# Patient Record
Sex: Female | Born: 1965
Health system: Southern US, Community
[De-identification: ages and names within clinical notes are randomized; demographics above are authoritative.]

## PROBLEM LIST (undated history)

## (undated) DIAGNOSIS — I10 Essential (primary) hypertension: Secondary | ICD-10-CM

## (undated) DIAGNOSIS — F32A Depression, unspecified: Secondary | ICD-10-CM

## (undated) DIAGNOSIS — G629 Polyneuropathy, unspecified: Secondary | ICD-10-CM

## (undated) DIAGNOSIS — F329 Major depressive disorder, single episode, unspecified: Secondary | ICD-10-CM

## (undated) DIAGNOSIS — F988 Other specified behavioral and emotional disorders with onset usually occurring in childhood and adolescence: Secondary | ICD-10-CM

## (undated) DIAGNOSIS — G459 Transient cerebral ischemic attack, unspecified: Secondary | ICD-10-CM

## (undated) DIAGNOSIS — Z9689 Presence of other specified functional implants: Secondary | ICD-10-CM

## (undated) DIAGNOSIS — Z9884 Bariatric surgery status: Secondary | ICD-10-CM

## (undated) HISTORY — PX: EYE SURGERY: SHX253

## (undated) HISTORY — PX: ABDOMINAL HYSTERECTOMY: SHX81

## (undated) HISTORY — PX: ANKLE SURGERY: SHX546

## (undated) HISTORY — PX: CHOLECYSTECTOMY: SHX55

## (undated) HISTORY — PX: HAND SURGERY: SHX662

## (undated) HISTORY — PX: BACK SURGERY: SHX140

## (undated) HISTORY — PX: GASTRIC BYPASS: SHX52

## (undated) HISTORY — PX: TONSILLECTOMY: SUR1361

---

## 2008-07-12 ENCOUNTER — Ambulatory Visit (HOSPITAL_BASED_OUTPATIENT_CLINIC_OR_DEPARTMENT_OTHER): Admission: RE | Admit: 2008-07-12 | Discharge: 2008-07-12 | Payer: Self-pay | Admitting: Ophthalmology

## 2008-07-24 ENCOUNTER — Other Ambulatory Visit: Admission: RE | Admit: 2008-07-24 | Discharge: 2008-07-24 | Payer: Self-pay | Admitting: Obstetrics and Gynecology

## 2008-09-26 ENCOUNTER — Ambulatory Visit (HOSPITAL_COMMUNITY): Admission: RE | Admit: 2008-09-26 | Discharge: 2008-09-27 | Payer: Self-pay | Admitting: Obstetrics and Gynecology

## 2008-09-26 ENCOUNTER — Encounter (INDEPENDENT_AMBULATORY_CARE_PROVIDER_SITE_OTHER): Payer: Self-pay | Admitting: Obstetrics and Gynecology

## 2009-04-17 ENCOUNTER — Emergency Department (HOSPITAL_COMMUNITY): Admission: EM | Admit: 2009-04-17 | Discharge: 2009-04-17 | Payer: Self-pay | Admitting: Emergency Medicine

## 2009-11-15 ENCOUNTER — Encounter: Payer: Self-pay | Admitting: Internal Medicine

## 2009-11-15 ENCOUNTER — Emergency Department (HOSPITAL_COMMUNITY): Admission: EM | Admit: 2009-11-15 | Discharge: 2009-11-15 | Payer: Self-pay | Admitting: Emergency Medicine

## 2009-11-17 ENCOUNTER — Encounter: Payer: Self-pay | Admitting: Internal Medicine

## 2009-12-22 DIAGNOSIS — I471 Supraventricular tachycardia, unspecified: Secondary | ICD-10-CM | POA: Insufficient documentation

## 2009-12-23 ENCOUNTER — Ambulatory Visit: Payer: Self-pay | Admitting: Internal Medicine

## 2010-01-14 ENCOUNTER — Ambulatory Visit: Payer: Self-pay

## 2010-01-14 ENCOUNTER — Ambulatory Visit: Payer: Self-pay | Admitting: Internal Medicine

## 2010-01-14 ENCOUNTER — Ambulatory Visit: Payer: Self-pay | Admitting: Cardiology

## 2010-01-14 ENCOUNTER — Encounter: Payer: Self-pay | Admitting: Internal Medicine

## 2010-01-14 ENCOUNTER — Ambulatory Visit (HOSPITAL_COMMUNITY): Admission: RE | Admit: 2010-01-14 | Discharge: 2010-01-14 | Payer: Self-pay | Admitting: Internal Medicine

## 2010-01-14 DIAGNOSIS — R000 Tachycardia, unspecified: Secondary | ICD-10-CM | POA: Insufficient documentation

## 2010-01-14 DIAGNOSIS — R0602 Shortness of breath: Secondary | ICD-10-CM | POA: Insufficient documentation

## 2010-01-15 LAB — CONVERTED CEMR LAB
BUN: 16 mg/dL (ref 6–23)
Basophils Absolute: 0 10*3/uL (ref 0.0–0.1)
Calcium: 9.2 mg/dL (ref 8.4–10.5)
Chloride: 101 meq/L (ref 96–112)
Eosinophils Absolute: 0.2 10*3/uL (ref 0.0–0.7)
GFR calc non Af Amer: 108.13 mL/min (ref >60)
HCT: 34.8 % — ABNORMAL LOW (ref 36.0–46.0)
Lymphocytes Relative: 19.8 % (ref 12.0–46.0)
Lymphs Abs: 1.6 10*3/uL (ref 0.7–4.0)
MCHC: 34.2 g/dL (ref 30.0–36.0)
Monocytes Absolute: 0.5 10*3/uL (ref 0.1–1.0)
Potassium: 4.5 meq/L (ref 3.5–5.1)
Prothrombin Time: 10.2 s (ref 9.7–11.8)

## 2010-01-21 ENCOUNTER — Ambulatory Visit (HOSPITAL_COMMUNITY): Admission: RE | Admit: 2010-01-21 | Discharge: 2010-01-21 | Payer: Self-pay | Admitting: Internal Medicine

## 2010-01-21 ENCOUNTER — Ambulatory Visit: Payer: Self-pay | Admitting: Internal Medicine

## 2010-02-02 ENCOUNTER — Telehealth (INDEPENDENT_AMBULATORY_CARE_PROVIDER_SITE_OTHER): Payer: Self-pay | Admitting: *Deleted

## 2010-02-03 ENCOUNTER — Encounter: Payer: Self-pay | Admitting: Internal Medicine

## 2010-02-10 ENCOUNTER — Ambulatory Visit: Payer: Self-pay | Admitting: Internal Medicine

## 2010-02-10 ENCOUNTER — Ambulatory Visit (HOSPITAL_COMMUNITY): Admission: RE | Admit: 2010-02-10 | Discharge: 2010-02-10 | Payer: Self-pay | Admitting: Internal Medicine

## 2010-02-25 ENCOUNTER — Encounter: Payer: Self-pay | Admitting: Internal Medicine

## 2010-02-26 ENCOUNTER — Ambulatory Visit: Payer: Self-pay | Admitting: Internal Medicine

## 2010-06-28 ENCOUNTER — Encounter: Payer: Self-pay | Admitting: Obstetrics and Gynecology

## 2010-07-07 NOTE — Assessment & Plan Note (Signed)
Summary: nep/tachycardia/jml   CC:  new patient.  Tachycardia.  Pt describes this as a pounding sensation in her chest.  .  History of Present Illness: [name] is seen at the request of Dr. Tiburcio Pea because of recurrent tachycardia palpitations with documented supraventricular tachycardia.  She is a 45 year old woman with a history of chronic pain syndromes related to neuropathies for which she is on multiple narcotics. She also has a ten-year history of abrupt onset onset tachypalpitations typically lasting just minutes.  Recently she had an episode however that awakened her from sleep and because it did not abate she went to the emergency room She was treated with diltiazem with termination and sounds like recurrence and termination again. ECG was obtained that documented narrow QRS tachycardia and a cycle length of 400 ms. These episodes are "frog positive" as well as associated with chest pressure and shortness of breath there is no lightheadedness. they are diuretic negative.    Current Medications (verified): 1)  Neurontin 400 Mg Caps (Gabapentin) .... 3 Capsules Four Times Daily 2)  Nortriptyline Hcl 75 Mg Caps (Nortriptyline Hcl) .... Take Two Capsules Two Times A Day 3)  Opana Er 30 Mg Xr12h-Tab (Oxymorphone Hcl) .... Take One Capsule Three Times A Day 4)  Opana 10 Mg Tabs (Oxymorphone Hcl) .... As Needed For Pain 5)  Avinza 90 Mg Xr24h-Cap (Morphine Sulfate Beads) .... Take One Capsule Three Times A Day 6)  Aspirin 81 Mg Tabs (Aspirin) .... Once Daily 7)  Diltiazem Hcl 120 Mg Tabs (Diltiazem Hcl) .... Take One Tablet Once Daily  Allergies (verified): 1)  ! Iodine 2)  ! * Iv Ig  Past History:  Past Medical History: Last updated: 03-Jan-2010 palpitations Paroxysmal supraventricular tachycardia degenerative disk disease history of TIAs demyelinating polyneuropathy staph and E. coli infection-2005  Past Surgical History: Last updated: Jan 03, 2010 Vaginal hysterectomy eye  surgery on July 12, 2008 gastric bypass  in 2001 cholecystectomy in 1998 tonsillectomy in 1974  Family History: Last updated: 01-03-10 Father: deceased-HTN, cancer, thyroid,aneurysm Mother:  alive-osteoporosis, melanoma  Social History: Last updated: 2010/01/03 Tobacco Use - Former.  [phone] Alcohol Use - yes Drug Use - no Disabled  Married   Review of Systems       full review of systems was negative apart from a history of present illness and past medical history.   Vital Signs:  Patient profile:   45 year old female Height:      67.5 inches Weight:      214 pounds BMI:     33.14 Pulse rate:   81 / minute Pulse rhythm:   regular BP sitting:   116 / 73  (left arm) Cuff size:   regular  Vitals Entered By: Judithe Modest CMA (December 23, 2009 3:57 PM)  Physical Exam  General:  Well developed, well nourished middle-aged Caucasian female appearing her stated age, in no acute distress. Head:  normal HEENT Neck:  supple without thyromegaly or venous distention Chest Wall:  without kyphosis or scoliosis or CVA tenderness Lungs:  clear to auscultation Heart:  regular rate and rhythm with a widely split but mobile S2 no significant murmurs were noted Abdomen:  soft nontender without hepatomegaly or midline pulsation Msk:  normal muscle strength and tone Pulses:  intact distal pulses Extremities:  No clubbing or cyanosis. Neurologic:  alert and oriented and grossly normal motor function Skin:  warm and dry Psych:  somewhat flat affect   EKG  Procedure date:  12/23/2009  Findings:  sinus rhythm with an R. prime in lead V1    EKG  Procedure date:  11/15/2009  Findings:      narrow QRS 148 pseudo s wave 2,3,f and broader Rwave in V1  Impression & Recommendations:  Problem # 1:  PAROXYSMAL SUPRAVENTRICULAR TACHYCARDIA (ICD-427.0) the patient has supraventricular tachycardia documented and probably AV node reentry by ECG and symptoms. We have discussed  treatment options including doing nothing, using vagal maneuvers with or without drug therapy including beta blockers and/or calcium blockers, antiarrhythmic drugs with the potential for proarrhythmia as well as catheter ablation. We discussed potential benefits as well as potential risks including but not limited to death perforation and heart block requiring permanent pacemaker implantation. She understands these risks and would like to proceed.  we will require an echo cardiogram prior to catheter ablation.  Given her chronic polyneuropathy and her narcotic dependence, the procedure will be done with anesthesia support. Her updated medication list for this problem includes:    Aspirin 81 Mg Tabs (Aspirin) ..... Once daily    Diltiazem Hcl 120 Mg Tabs (Diltiazem hcl) .Marland Kitchen... Take one tablet once daily  Orders: EKG w/ Interpretation (93000) Echocardiogram (Echo)  Problem # 2:  CHRONIC PAIN SYNDROME SECONDARY TO NEUROPATHY (ICD-338.4) see above  Patient Instructions: 1)  Your physician has requested that you have an echocardiogram.  Echocardiography is a painless test that uses sound waves to create images of your heart. It provides your doctor with information about the size and shape of your heart and how well your heart's chambers and valves are working.  This procedure takes approximately one hour. There are no restrictions for this procedure. 2)  Your physician has recommended that you have an ablation.  Catheter ablation is a medical procedure used to treat some cardiac arrhythmias (irregular heartbeats). During catheter ablation, a long, thin, flexible tube is put into a blood vessel in your groin (upper thigh), or neck. This tube is called an ablation catheter. It is then guided to your heart through the blood vessel. Radiofrequency waves destroy small areas of heart tissue where abnormal heartbeats may cause an arrhythmia to start.  Please see the instruction sheet given to you today.

## 2010-07-07 NOTE — Assessment & Plan Note (Signed)
Summary: eph/post ablation/mt   Visit Type:  Follow-up Primary Provider:  Durwin Nora, MD   History of Present Illness: [name] is seen in followup for ablation of SVT that turned out to be AV nodal reentry. She's had no recurrent tachypalpitations.  She did describe having gone in for her TEE which was done to exclude right to left shunting for her TIA. This parenthetically was negative. She notes however that when she awakened and went home that narcotics from her pocketbook allegedly had been removed. She did not followup with the hospital about this. (I have spoken with the director of endoscopy about this)  Current Medications (verified): 1)  Neurontin 400 Mg Caps (Gabapentin) .... 3 Capsules Four Times Daily 2)  Nortriptyline Hcl 75 Mg Caps (Nortriptyline Hcl) .... Take Two Capsules At Bedtime 3)  Opana 10 Mg Tabs (Oxymorphone Hcl) .... 5 Tabs Daily 4)  Avinza 90 Mg Xr24h-Cap (Morphine Sulfate Beads) .... Take One Capsule Three Times A Day 5)  Aspirin 81 Mg Tabs (Aspirin) .... Once Daily  Allergies: 1)  ! Iodine 2)  ! * Iv Ig  Past History:  Past Medical History: Last updated: 12/22/2009 palpitations Paroxysmal supraventricular tachycardia degenerative disk disease history of TIAs demyelinating polyneuropathy staph and E. coli infection-2005  Vital Signs:  Patient profile:   45 year old female Height:      67.5 inches Weight:      213 pounds BMI:     32.99 Pulse rate:   85 / minute BP sitting:   130 / 90  (left arm)  Vitals Entered By: Laurance Flatten CMA (February 26, 2010 2:31 PM)  Physical Exam  General:  The patient was alert and oriented in no acute distress. HEENT Normal.  Neck veins were flat, carotids were brisk.  Lungs were clear.  Heart sounds were regular without murmurs or gallops.  Abdomen was soft with active bowel sounds. There is no clubbing cyanosis or edema. Skin Warm and dry    EKG  Procedure date:  02/26/2010  Findings:  sinus at 70 15/..08/.41 0/w normal   Impression & Recommendations:  Problem # 1:  PAROXYSMAL SUPRAVENTRICULAR TACHYCARDIA (ICD-427.0) s/p ablation The following medications were removed from the medication list:    Diltiazem Hcl 120 Mg Tabs (Diltiazem hcl) .Marland Kitchen... Take one tablet once daily Her updated medication list for this problem includes:    Aspirin 81 Mg Tabs (Aspirin) ..... Once daily  Patient Instructions: 1)  Your physician recommends that you schedule a follow-up appointment in: As Needed 2)  Your physician recommends that you continue on your current medications as directed. Please refer to the Current Medication list given to you today.

## 2010-07-07 NOTE — Letter (Signed)
Summary: ELectrophysiology/Ablation Procedure Instructions  [employer], Main Office  [address]   [place], [place] [address]   Phone: [phone]  Fax: [phone]     Electrophysiology/Ablation Procedure Instructions    You are scheduled for a(n) ABLATION on [facility], January 21, 2010 at 8:30 AM with Dr. Graciela Husbands.  1.  Please come to the Short Stay Center at [facility] at 6: 30 AM on the day of your procedure.  2.  Come prepared to stay overnight.   Please bring your insurance cards and a list of your medications.  3.  Come to the [facility] office on January 14, 2010 for lab work.  The lab at [facility] is open from 8:30 AM to 1:30 PM and 2:30 PM to 5:00 PM.  The lab at [facility] is open from 7:30 AM to 5:30 PM.  You do not have to be fasting.  4.  Do not have anything to eat or drink after midnight the night before your procedure.  5.  All of your medications may be taken with a small amount of water.     * Occasionally, EP studies and ablations can become lengthy.  Please make your family aware of this before your procedure starts.  Average time ranges from 2-8 hours for EP studies/ablations.  Your physician will locate your family after the procedure with the results.  * If you have any questions after you get home, please call the office at [phone].

## 2010-07-07 NOTE — Letter (Signed)
Summary: [facility]   Imported By: Marylou Mccoy 01/07/2010 10:56:02  _____________________________________________________________________  External Attachment:    Type:   Image     Comment:   External Document

## 2010-07-07 NOTE — Progress Notes (Signed)
  Phone Note Outgoing Call   Call placed by: Scherrie Bateman, LPN,  February 02, 2010 9:14 AM Call placed to: Patient Summary of Call: LEFT MESSAGE FOR PT TO CALL BACK NEEDS TEE WITH BUBBLE STUDY SCHEDULED .  Initial call taken by: Scherrie Bateman, LPN,  February 02, 2010 9:15 AM  Follow-up for Phone Call        [facility] Scherrie Bateman, LPN  February 03, 2010 12:05 PM  PT AWARE TEE WITH BUBBLE STUDY SCHEDULED FOR 02/10/10 AT 10:30. Follow-up by: Scherrie Bateman, LPN,  February 03, 2010 12:55 PM

## 2010-07-07 NOTE — Miscellaneous (Signed)
  Clinical Lists Changes  Observations: Added new observation of RESULTS MISC: IMPRESSION: 1. Normal sinus function. 2. Normal atrial function. 3. Abnormal AV nodal function manifested by dual AV nodal physiology. 4. Normal His-Purkinje system function. 5. No accessory pathway. 6. Normal ventricular response programed stimulation.   SUMMARY:  In conclusion, the results of electrophysiological testing confirmed AV nodal reentry as the patient's mechanism of tachycardia. Slow pathway modification was successful with the elimination of antegrade slow pathway function.  Given the difficulties in induction of tachycardia, junctional rhythm and the elimination of slow pathway function were used as endpoints.  This predicts a high likelihood of success.  The patient will be observed overnight and anticipate discharge tomorrow.       (01/21/2010 8:57) Added new observation of ECHOINTERP:  - Left ventricle: The cavity size was normal. Systolic function was     normal. The estimated ejection fraction was in the range of 50% to     55%. Doppler parameters are consistent with abnormal left     ventricular relaxation (grade 1 diastolic dysfunction). Doppler     parameters are consistent with high ventricular filling pressure.   - Aortic valve: Mild regurgitation.   - Mitral valve: Calcified annulus. Mildly thickened leaflets . Mild     regurgitation. (01/14/2010 8:55)      Echocardiogram  Procedure date:  01/14/2010  Findings:       - Left ventricle: The cavity size was normal. Systolic function was     normal. The estimated ejection fraction was in the range of 50% to     55%. Doppler parameters are consistent with abnormal left     ventricular relaxation (grade 1 diastolic dysfunction). Doppler     parameters are consistent with high ventricular filling pressure.   - Aortic valve: Mild regurgitation.   - Mitral valve: Calcified annulus. Mildly thickened leaflets . Mild  regurgitation.  MISC. Report  Procedure date:  01/21/2010  Findings:      IMPRESSION: 1. Normal sinus function. 2. Normal atrial function. 3. Abnormal AV nodal function manifested by dual AV nodal physiology. 4. Normal His-Purkinje system function. 5. No accessory pathway. 6. Normal ventricular response programed stimulation.   SUMMARY:  In conclusion, the results of electrophysiological testing confirmed AV nodal reentry as the patient's mechanism of tachycardia. Slow pathway modification was successful with the elimination of antegrade slow pathway function.  Given the difficulties in induction of tachycardia, junctional rhythm and the elimination of slow pathway function were used as endpoints.  This predicts a high likelihood of success.  The patient will be observed overnight and anticipate discharge tomorrow.

## 2010-07-07 NOTE — Letter (Signed)
Summary: Cardioversion/TEE Instructions  [employer] [employer], Main Office  [address]   [place], [place] [address]   Phone: [phone]  Fax: [phone]     TEE WITH BUBBLE STUDY Instructions  02/03/2010 MRN: [id number]  [facility] [address] [place], [place]  [address]  Dear Ms. Branam, You are scheduled for a TEE WITH BUBBLE STUDY on __________________9/6/11__________ with Dr. __BENSIMHON_______________________________________.   Please arrive at the [facility] of [facility] at _______9:30______ a.m. / p.m. on the day of your procedure.  1)   DIET:  A)   Nothing to eat or drink after midnight except your medications with a sip of water.  B)   YOU MAY TAKE ALL of your remaining medications with a small amount of water.         ___________________________________________________________________     ___________________________________________________________________  5)  Must have a responsible person to drive you home.  6)   Bring a current list of your medications and current insurance cards.   * Special Note:  Every effort is made to have your procedure done on time. Occasionally there are emergencies that present themselves at the hospital that may cause delays. Please be patient if a delay does occur.  * If you have any questions after you get home, please call the office at [phone].

## 2010-07-30 ENCOUNTER — Telehealth (INDEPENDENT_AMBULATORY_CARE_PROVIDER_SITE_OTHER): Payer: Self-pay | Admitting: *Deleted

## 2010-08-04 NOTE — Progress Notes (Signed)
Summary: Records Request  Faxed OV, EKG, Echo & Cath to Jonnie Finner, NP at [facility] ([phone]). Debby Freiberg  July 30, 2010 12:24 PM

## 2010-08-24 LAB — POCT CARDIAC MARKERS
Myoglobin, poc: 29.2 ng/mL (ref 12–200)
Myoglobin, poc: 49.7 ng/mL (ref 12–200)
Troponin i, poc: <0.05 ng/mL (ref 0.00–0.09)
Troponin i, poc: <0.05 ng/mL (ref 0.00–0.09)

## 2010-08-24 LAB — BASIC METABOLIC PANEL
CO2: 31 mEq/L (ref 19–32)
Creatinine, Ser: 0.69 mg/dL (ref 0.4–1.2)
GFR calc non Af Amer: >60 mL/min (ref >60)
Glucose, Bld: 126 mg/dL — ABNORMAL HIGH (ref 70–99)
Potassium: 3.4 mEq/L — ABNORMAL LOW (ref 3.5–5.1)

## 2010-08-24 LAB — CBC
MCHC: 33.1 g/dL (ref 30.0–36.0)
MCV: 91.1 fL (ref 78.0–100.0)
Platelets: 276 10*3/uL (ref 150–400)

## 2010-09-07 ENCOUNTER — Emergency Department (HOSPITAL_COMMUNITY)
Admission: EM | Admit: 2010-09-07 | Discharge: 2010-09-07 | Disposition: A | Discharge status: Home / Self Care | Payer: Medicare Other | Attending: Emergency Medicine | Admitting: Emergency Medicine

## 2010-09-07 DIAGNOSIS — I10 Essential (primary) hypertension: Secondary | ICD-10-CM | POA: Insufficient documentation

## 2010-09-07 DIAGNOSIS — G8929 Other chronic pain: Secondary | ICD-10-CM | POA: Insufficient documentation

## 2010-09-07 DIAGNOSIS — M545 Low back pain, unspecified: Secondary | ICD-10-CM | POA: Insufficient documentation

## 2010-09-07 DIAGNOSIS — Z8673 Personal history of transient ischemic attack (TIA), and cerebral infarction without residual deficits: Secondary | ICD-10-CM | POA: Insufficient documentation

## 2010-09-07 DIAGNOSIS — Z79899 Other long term (current) drug therapy: Secondary | ICD-10-CM | POA: Insufficient documentation

## 2010-09-16 LAB — TYPE AND SCREEN: ABO/RH(D): O POS

## 2010-09-16 LAB — CBC
HCT: 31.9 % — ABNORMAL LOW (ref 36.0–46.0)
HCT: 35.7 % — ABNORMAL LOW (ref 36.0–46.0)
Hemoglobin: 10.7 g/dL — ABNORMAL LOW (ref 12.0–15.0)
MCHC: 33.2 g/dL (ref 30.0–36.0)
MCHC: 33.5 g/dL (ref 30.0–36.0)
MCV: 83.2 fL (ref 78.0–100.0)
MCV: 83.7 fL (ref 78.0–100.0)
Platelets: 191 10*3/uL (ref 150–400)
RBC: 3.81 MIL/uL — ABNORMAL LOW (ref 3.87–5.11)
RBC: 4.29 MIL/uL (ref 3.87–5.11)
RDW: 22.8 % — ABNORMAL HIGH (ref 11.5–15.5)
WBC: 6.8 10*3/uL (ref 4.0–10.5)
WBC: 6.9 10*3/uL (ref 4.0–10.5)

## 2010-09-16 LAB — BASIC METABOLIC PANEL
BUN: 9 mg/dL (ref 6–23)
CO2: 32 mEq/L (ref 19–32)
Calcium: 9 mg/dL (ref 8.4–10.5)
Chloride: 104 mEq/L (ref 96–112)
Creatinine, Ser: 0.64 mg/dL (ref 0.4–1.2)
Sodium: 140 mEq/L (ref 135–145)

## 2010-09-16 LAB — URINALYSIS, ROUTINE W REFLEX MICROSCOPIC
Bilirubin Urine: NEGATIVE
Glucose, UA: NEGATIVE mg/dL
Ketones, ur: NEGATIVE mg/dL
Leukocytes, UA: NEGATIVE
Nitrite: NEGATIVE
Protein, ur: NEGATIVE mg/dL
Specific Gravity, Urine: <1.005 — ABNORMAL LOW (ref 1.005–1.030)
Urobilinogen, UA: 0.2 mg/dL (ref 0.0–1.0)
pH: 5.5 (ref 5.0–8.0)

## 2010-09-16 LAB — URINE MICROSCOPIC-ADD ON

## 2010-09-16 LAB — PREGNANCY, URINE: Preg Test, Ur: NEGATIVE

## 2010-10-20 NOTE — H&P (Signed)
[name], Watson NO.:  [id number]   MEDICAL RECORD NO.:  [id number]          PATIENT TYPE:  AMB   LOCATION:  SDC                           FACILITY:  [facility]   PHYSICIAN:  Gerald Leitz, MD          DATE OF BIRTH:  11-03-1965   DATE OF ADMISSION:  DATE OF DISCHARGE:                              HISTORY & PHYSICAL   The patient is scheduled for surgery on September 26, 2008.   HISTORY OF PRESENT ILLNESS:  This is a 45 year old G2, P2 with  menorrhagia and resulting anemia.  She has a history of TIAs and she  desires definitive therapy with vaginal hysterectomy.  She has had  episodes where she has saturated her clothing due to the heavy menses.   PAST OB HISTORY:  Spontaneous vaginal delivery x2.   PAST GYN HISTORY:  Contraception.  Husband is infertile.  No history of  sexually transmitted diseases.  Last Pap smear was July 24, 2008,  and this was normal.  No history of abnormal Pap smears.   PAST MEDICAL HISTORY:  Chronic inflammatory demyelinating  polyneuropathy, degenerative disk disease, and bursitis.   PAST SURGICAL HISTORY:  Eye surgery on July 12, 2008, gastric bypass  in 2001, cholecystectomy in 1998, tonsillectomy in 1974.   HOSPITALIZATIONS:  TIA in 2007, staph and E. coli infection in November  2005.  Pregnancy and deliveries in 1995 and 1999.   MEDICATIONS:  Pamelor, iron, Neurontin, Celebrex, Avinza, OxyContin,  Dilaudid.   ALLERGIES:  IODINE.   SOCIAL HISTORY:  The patient is married.  She is disabled secondary to  neuropathy.  She denies tobacco use.  Positive alcohol use.  No illicit  drug use.   FAMILY HISTORY:  Negative for breast, ovarian, and colon cancer.   REVIEW OF SYSTEMS:  Negative except as stated in history of current  illness.   PHYSICAL EXAMINATION:  VITAL SIGNS:  Blood pressure 122/88, weight 196  pounds.  GENERAL:  Alert and oriented in no acute distress.  CARDIOVASCULAR:  Regular rate and rhythm.  LUNGS:  Clear to  auscultation bilaterally.  ABDOMEN:  Soft and nondistended.  No masses.  PELVIC:  Normal external female genitalia.  No vulvar, vaginal, or  cervical lesions are noted.  Bimanual exam reveals normal-sized uterus.  No adnexal masses or tenderness.  EXTREMITIES:  No clubbing, cyanosis, or edema.  Pelvic ultrasound  performed on August 06, 2008, showed the uterus to be normal size and  shape.  The right and left ovary appeared normal.   IMPRESSION AND PLAN:  A 45 year old with menorrhagia and anemia desires  definitive therapy via vaginal hysterectomy.  Risks, benefits, and  alternatives of this surgery were discussed with the patient including  but not limited to infection, bleeding, damage to bowel and bladder and  surrounding organs and ureter with the need for further surgery risk of  transfusion, HIV, hepatitis B and C were discussed.  She voiced  understanding of all risk and desires to proceed with vaginal  hysterectomy.  Preservation of the ovaries as long as  they appear  normal.      Gerald Leitz, MD  Electronically Signed     TC/MEDQ  D:  09/17/2008  T:  09/18/2008  Job:  [id number]

## 2010-10-20 NOTE — Op Note (Signed)
[name], [name] NO.:  [id number]   MEDICAL RECORD NO.:  [id number]          PATIENT TYPE:  OIB   LOCATION:  9317                          FACILITY:  [facility]   PHYSICIAN:  Gerald Leitz, MD          DATE OF BIRTH:  Jul 26, 1965   DATE OF PROCEDURE:  09/26/2008  DATE OF DISCHARGE:                               OPERATIVE REPORT   PREOPERATIVE DIAGNOSES:  1. Menorrhagia.  2. Anemia.   POSTOPERATIVE DIAGNOSES:  1. Menorrhagia.  2. Anemia.   PROCEDURE:  Vaginal hysterectomy.   SURGEON:  Gerald Leitz, MD   ASSISTANT:  Bing Neighbors. Sydnee Cabal, MD   ANESTHESIA:  General.   FINDINGS:  Normal uterus, ovaries, and fallopian tubes.   SPECIMEN:  Uterus and cervix.   DISPOSITION OF SPECIMEN:  Pathology.   URINE OUTPUT:  200 mL.   FLUIDS:  Per anesthesia.   ESTIMATED BLOOD LOSS:  150 mL.   COMPLICATIONS:  None.   INDICATIONS:  A 45 year old with menorrhagia and anemia, who desired  definitive therapy via vaginal hysterectomy.   PROCEDURE IN DETAIL:  The patient was taken to the operating room where  she was placed under general anesthesia.  She was then placed in the  dorsal lithotomy position.  She was prepped and draped in the usual  sterile fashion. A Foley catheter was placed.  A weighted speculum was  placed into the vaginal vault.  Cervix was grasped with Lahey clamps, 1%  lidocaine with epinephrine were injected along  the cervix.  The cervix  was then incised circumferentially with the scalpel.  The vesicouterine  peritoneum was dissected off the cervix and vesicouterine peritoneum was  tented up and entered sharply with Metzenbaum scissors.  Retractor was  then placed into the abdominal cavity.  Attention was turned to the  posterior cul-de-sac, which was incised with Mayo scissors.  Entry to  the posterior cul-de-sac was confirmed and the weighted speculum was  then placed.  The uterosacral ligaments were clamped with Heaney clamps  bilaterally, transected  and suture ligated with Heaney clamp with 0  Vicryl.  The uterine arteries then clamped bilaterally with Heaney  clamps, transected, and suture ligated with 0 Vicryl followed by the  cardinal ligament which was clamped with Heaney clamps, transected, and  suture ligated with 0 Vicryl bilaterally.  The utero-ovarian ligaments  were clamped bilaterally, transected, and suture ligated with free tie  of 0 Vicryl, followed by suture ligature of 0 Vicryl.  Excellent  hemostasis was noted.  The specimen was handed off.  The fallopian tubes  and ovaries were inspected and appeared normal.  All pedicles were  inspected and excellent hemostasis was noted.  The long weighted  speculum was removed and a short weighted speculum was then placed.  The  vaginal cuff angles were sutured with 0 Vicryl.  The vaginal cuff was  closed with 0 Vicryl in a running locked fashion.  The patient was  awakened from anesthesia and taken to the recovery room awake and in  stable condition.  Sponge, lap and  needle counts were correct x2.  Two  grams of Ancef were given at the beginning of the procedure.      Gerald Leitz, MD  Electronically Signed     TC/MEDQ  D:  09/26/2008  T:  09/26/2008  Job:  [id number]

## 2010-10-20 NOTE — Op Note (Signed)
[name], [name] NO.:  [id number]   MEDICAL RECORD NO.:  [id number]          PATIENT TYPE:  AMB   LOCATION:  DSC                          FACILITY:  [facility]   PHYSICIAN:  Pasty Spillers. Maple Hudson, M.D. DATE OF BIRTH:  [birth year]   DATE OF PROCEDURE:  DATE OF DISCHARGE:                               OPERATIVE REPORT   PREOPERATIVE DIAGNOSES:  1. Intermittent esotropia, with diplopia.  2. History of previous bilateral lateral rectus muscle recessions and      bilateral medial rectus muscle recessions.   POSTOPERATIVE DIAGNOSES:  1. Intermittent esotropia, with diplopia.  2. History of previous bilateral lateral rectus muscle recessions and      bilateral medial rectus muscle resections.   PROCEDURE:  Right medial rectus muscle recession, 7.0 mm, adjustable  technique.   SURGEON:  Pasty Spillers. Young, MD   ANESTHESIA:  General (laryngeal mask).   COMPLICATIONS:  None.   DESCRIPTION OF PROCEDURE:  After preop evaluation including informed  consent, the patient was taken to the operating room where she was  identified by me.  General anesthesia was induced without difficulty  after placement of appropriate monitors.  The patient was prepped and  draped in sterile fashion.  Note that no iodine-containing solutions  were used, due to the patient's IODINE allergy.   A traction suture of 6-0 silk was placed at the superior and inferior  limbus, and this was used to draw the eye temporally.  A limbal  conjunctival peritomy of 2 o'clock hours extent was made nasally in the  right eye with Westcott scissors, with relaxing incisions in the  superonasal and inferonasal quadrants.  The right medial rectus muscle  was engaged on a series of muscle hooks.  It was cleared of its  surrounding fascial attachments and extensive scar tissue.  The muscle  was secured with a double-arm 6-0 Vicryl suture, with a double-locking  bite at each border of muscle, 1 mm from the insertion.   The muscle was  disinserted, and its current insertion was measured to be approximately  6.0 mm posterior to the limbus.  Each pole suture was passed back into  the muscle stump, 6 mm posterior to the limbus, in crossed swords  fashion.  The muscle was drawn up to the level of the insertion, and the  2 pole sutures were tied together approximately 10 cm above the sclera.  The pole sutures were then joined together at a measured distance of 7.0  mm above the sclera, and a noose was tied around the 2 pole sutures at  this location.  The muscle was then allowed to hang back until the noose  reached sclera, giving a hang back recession of 7.0 mm.  The superior  corner of the conjunctival flap was reapposed to the native conjunctiva  with a 6-0 plain gut suture, leaving conjunctiva recessed approximately  4 mm posterior to the limbus, enough to cover the Vicryl knot.  The  relaxing incision was closed with a second interrupted 6-0 plain gut  suture.  A large loop of 6-0 plain  gut was used to join the inferior  corner of the conjunctival flap to the inferior conjunctival bed.  This  flap was left open to allow access through for the suture adjustment.  Traction suture was repositioned from the superior and inferior limbus  to the  nasal limbus.  The pole, noose, and traction sutures were taped to the  right cheek.  TobraDex ointment was placed in the eye, followed by a  soft patch.  The patient was awakened without difficulty and taken to  the recovery in stable condition, having suffered no intraoperative or  immediate postoperative complications.      Pasty Spillers. Maple Hudson, M.D.  Electronically Signed     WOY/MEDQ  D:  07/12/2008  T:  07/13/2008  Job:  [id number]

## 2010-11-25 ENCOUNTER — Other Ambulatory Visit: Payer: Self-pay | Admitting: Pain Medicine

## 2010-11-25 DIAGNOSIS — M542 Cervicalgia: Secondary | ICD-10-CM

## 2010-11-26 ENCOUNTER — Ambulatory Visit
Admission: RE | Admit: 2010-11-26 | Discharge: 2010-11-26 | Disposition: A | Discharge status: Home / Self Care | Payer: Medicare Other | Source: Ambulatory Visit | Attending: Pain Medicine | Admitting: Pain Medicine

## 2010-11-26 DIAGNOSIS — M542 Cervicalgia: Secondary | ICD-10-CM

## 2011-03-16 ENCOUNTER — Ambulatory Visit: Payer: Medicare Other | Attending: Pain Medicine

## 2011-03-16 DIAGNOSIS — R5381 Other malaise: Secondary | ICD-10-CM | POA: Insufficient documentation

## 2011-03-16 DIAGNOSIS — M542 Cervicalgia: Secondary | ICD-10-CM | POA: Insufficient documentation

## 2011-03-16 DIAGNOSIS — M2550 Pain in unspecified joint: Secondary | ICD-10-CM | POA: Insufficient documentation

## 2011-03-16 DIAGNOSIS — M545 Low back pain, unspecified: Secondary | ICD-10-CM | POA: Insufficient documentation

## 2011-03-16 DIAGNOSIS — IMO0001 Reserved for inherently not codable concepts without codable children: Secondary | ICD-10-CM | POA: Insufficient documentation

## 2011-03-23 ENCOUNTER — Ambulatory Visit: Payer: Medicare Other

## 2011-03-31 ENCOUNTER — Ambulatory Visit: Payer: Medicare Other | Admitting: Physical Therapy

## 2011-04-01 ENCOUNTER — Ambulatory Visit: Payer: Medicare Other

## 2011-04-06 ENCOUNTER — Encounter: Payer: Medicare Other | Admitting: Physical Therapy

## 2011-04-08 ENCOUNTER — Ambulatory Visit: Payer: Medicare Other | Attending: Pain Medicine

## 2011-04-08 DIAGNOSIS — IMO0001 Reserved for inherently not codable concepts without codable children: Secondary | ICD-10-CM | POA: Insufficient documentation

## 2011-04-08 DIAGNOSIS — M545 Low back pain, unspecified: Secondary | ICD-10-CM | POA: Insufficient documentation

## 2011-04-08 DIAGNOSIS — M542 Cervicalgia: Secondary | ICD-10-CM | POA: Insufficient documentation

## 2011-04-08 DIAGNOSIS — R5381 Other malaise: Secondary | ICD-10-CM | POA: Insufficient documentation

## 2011-04-08 DIAGNOSIS — M2550 Pain in unspecified joint: Secondary | ICD-10-CM | POA: Insufficient documentation

## 2011-04-13 ENCOUNTER — Ambulatory Visit: Payer: Medicare Other | Admitting: Physical Therapy

## 2011-04-20 ENCOUNTER — Encounter: Payer: Medicare Other | Admitting: Physical Therapy

## 2011-04-22 ENCOUNTER — Encounter: Payer: Medicare Other | Admitting: Physical Therapy

## 2011-08-23 ENCOUNTER — Other Ambulatory Visit: Payer: Self-pay | Admitting: Family Medicine

## 2011-08-23 DIAGNOSIS — Z1231 Encounter for screening mammogram for malignant neoplasm of breast: Secondary | ICD-10-CM

## 2011-09-02 ENCOUNTER — Ambulatory Visit
Admission: RE | Admit: 2011-09-02 | Discharge: 2011-09-02 | Disposition: A | Discharge status: Home / Self Care | Payer: Medicare Other | Source: Ambulatory Visit | Attending: Family Medicine | Admitting: Family Medicine

## 2011-09-02 DIAGNOSIS — Z1231 Encounter for screening mammogram for malignant neoplasm of breast: Secondary | ICD-10-CM

## 2011-10-20 ENCOUNTER — Ambulatory Visit: Payer: Medicare Other | Attending: Pain Medicine | Admitting: Physical Therapy

## 2011-10-20 DIAGNOSIS — IMO0001 Reserved for inherently not codable concepts without codable children: Secondary | ICD-10-CM | POA: Insufficient documentation

## 2011-10-20 DIAGNOSIS — M545 Low back pain, unspecified: Secondary | ICD-10-CM | POA: Insufficient documentation

## 2011-10-20 DIAGNOSIS — M2550 Pain in unspecified joint: Secondary | ICD-10-CM | POA: Insufficient documentation

## 2011-10-26 ENCOUNTER — Ambulatory Visit: Payer: Medicare Other

## 2012-05-26 ENCOUNTER — Other Ambulatory Visit: Payer: Self-pay | Admitting: Pain Medicine

## 2012-05-26 ENCOUNTER — Ambulatory Visit
Admission: RE | Admit: 2012-05-26 | Discharge: 2012-05-26 | Disposition: A | Discharge status: Home / Self Care | Payer: Medicare Other | Source: Ambulatory Visit | Attending: Pain Medicine | Admitting: Pain Medicine

## 2012-05-26 DIAGNOSIS — R079 Chest pain, unspecified: Secondary | ICD-10-CM

## 2012-06-19 ENCOUNTER — Encounter (HOSPITAL_COMMUNITY): Payer: Self-pay | Admitting: Emergency Medicine

## 2012-06-19 ENCOUNTER — Emergency Department (HOSPITAL_COMMUNITY)
Admission: EM | Admit: 2012-06-19 | Discharge: 2012-06-19 | Disposition: A | Discharge status: Home / Self Care | Payer: Medicare Other | Attending: Emergency Medicine | Admitting: Emergency Medicine

## 2012-06-19 DIAGNOSIS — Z79899 Other long term (current) drug therapy: Secondary | ICD-10-CM | POA: Insufficient documentation

## 2012-06-19 DIAGNOSIS — F3289 Other specified depressive episodes: Secondary | ICD-10-CM | POA: Insufficient documentation

## 2012-06-19 DIAGNOSIS — Y9389 Activity, other specified: Secondary | ICD-10-CM | POA: Insufficient documentation

## 2012-06-19 DIAGNOSIS — F329 Major depressive disorder, single episode, unspecified: Secondary | ICD-10-CM | POA: Insufficient documentation

## 2012-06-19 DIAGNOSIS — Y92410 Unspecified street and highway as the place of occurrence of the external cause: Secondary | ICD-10-CM | POA: Insufficient documentation

## 2012-06-19 DIAGNOSIS — S335XXA Sprain of ligaments of lumbar spine, initial encounter: Secondary | ICD-10-CM | POA: Insufficient documentation

## 2012-06-19 DIAGNOSIS — Z8669 Personal history of other diseases of the nervous system and sense organs: Secondary | ICD-10-CM | POA: Insufficient documentation

## 2012-06-19 DIAGNOSIS — F988 Other specified behavioral and emotional disorders with onset usually occurring in childhood and adolescence: Secondary | ICD-10-CM | POA: Insufficient documentation

## 2012-06-19 HISTORY — DX: Depression, unspecified: F32.A

## 2012-06-19 HISTORY — DX: Major depressive disorder, single episode, unspecified: F32.9

## 2012-06-19 HISTORY — DX: Other specified behavioral and emotional disorders with onset usually occurring in childhood and adolescence: F98.8

## 2012-06-19 HISTORY — DX: Polyneuropathy, unspecified: G62.9

## 2012-06-19 MED ORDER — CYCLOBENZAPRINE HCL 10 MG PO TABS: 10.0000 mg | ORAL_TABLET | Freq: Two times a day (BID) | ORAL | Status: DC | PRN
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
## 2012-06-20 NOTE — ED Provider Notes (Signed)
Medical screening examination/treatment/procedure(s) were performed by non-physician practitioner and as supervising physician I was immediately available for consultation/collaboration.   Oma Alpert B. Castor Gittleman, MD 06/20/12 0710 

## 2012-08-16 ENCOUNTER — Ambulatory Visit
Admission: RE | Admit: 2012-08-16 | Discharge: 2012-08-16 | Disposition: A | Discharge status: Home / Self Care | Payer: BC Managed Care – PPO | Source: Ambulatory Visit | Attending: Family Medicine | Admitting: Family Medicine

## 2012-08-16 ENCOUNTER — Other Ambulatory Visit: Payer: Self-pay | Admitting: Family Medicine

## 2012-08-16 DIAGNOSIS — R091 Pleurisy: Secondary | ICD-10-CM

## 2012-10-16 ENCOUNTER — Other Ambulatory Visit: Payer: Self-pay

## 2012-10-16 DIAGNOSIS — Z1231 Encounter for screening mammogram for malignant neoplasm of breast: Secondary | ICD-10-CM

## 2012-10-17 ENCOUNTER — Ambulatory Visit
Admission: RE | Admit: 2012-10-17 | Discharge: 2012-10-17 | Disposition: A | Discharge status: Home / Self Care | Payer: BC Managed Care – PPO | Source: Ambulatory Visit

## 2012-10-17 DIAGNOSIS — Z1231 Encounter for screening mammogram for malignant neoplasm of breast: Secondary | ICD-10-CM

## 2012-10-23 ENCOUNTER — Other Ambulatory Visit: Payer: Self-pay | Admitting: Family Medicine

## 2012-10-23 DIAGNOSIS — M542 Cervicalgia: Secondary | ICD-10-CM

## 2012-10-24 ENCOUNTER — Ambulatory Visit
Admission: RE | Admit: 2012-10-24 | Discharge: 2012-10-24 | Disposition: A | Discharge status: Home / Self Care | Payer: Medicare Other | Source: Ambulatory Visit | Attending: Family Medicine | Admitting: Family Medicine

## 2012-10-24 DIAGNOSIS — M542 Cervicalgia: Secondary | ICD-10-CM

## 2013-02-25 ENCOUNTER — Encounter (HOSPITAL_COMMUNITY): Payer: Self-pay | Admitting: Emergency Medicine

## 2013-02-25 DIAGNOSIS — E871 Hypo-osmolality and hyponatremia: Secondary | ICD-10-CM | POA: Insufficient documentation

## 2013-02-25 DIAGNOSIS — R51 Headache: Secondary | ICD-10-CM | POA: Insufficient documentation

## 2013-02-25 DIAGNOSIS — F3289 Other specified depressive episodes: Secondary | ICD-10-CM | POA: Insufficient documentation

## 2013-02-25 DIAGNOSIS — R002 Palpitations: Secondary | ICD-10-CM | POA: Insufficient documentation

## 2013-02-25 DIAGNOSIS — Z7982 Long term (current) use of aspirin: Secondary | ICD-10-CM | POA: Insufficient documentation

## 2013-02-25 DIAGNOSIS — Z79899 Other long term (current) drug therapy: Secondary | ICD-10-CM | POA: Insufficient documentation

## 2013-02-25 DIAGNOSIS — F329 Major depressive disorder, single episode, unspecified: Secondary | ICD-10-CM | POA: Insufficient documentation

## 2013-02-25 DIAGNOSIS — Z8669 Personal history of other diseases of the nervous system and sense organs: Secondary | ICD-10-CM | POA: Insufficient documentation

## 2013-02-25 DIAGNOSIS — R0989 Other specified symptoms and signs involving the circulatory and respiratory systems: Secondary | ICD-10-CM | POA: Insufficient documentation

## 2013-02-25 DIAGNOSIS — Z8673 Personal history of transient ischemic attack (TIA), and cerebral infarction without residual deficits: Secondary | ICD-10-CM | POA: Insufficient documentation

## 2013-02-25 DIAGNOSIS — R0789 Other chest pain: Secondary | ICD-10-CM | POA: Insufficient documentation

## 2013-02-25 DIAGNOSIS — R0609 Other forms of dyspnea: Secondary | ICD-10-CM | POA: Insufficient documentation

## 2013-02-25 DIAGNOSIS — F988 Other specified behavioral and emotional disorders with onset usually occurring in childhood and adolescence: Secondary | ICD-10-CM | POA: Insufficient documentation

## 2013-02-25 LAB — CBC
HCT: 31.8 % — ABNORMAL LOW (ref 36.0–46.0)
Hemoglobin: 10.9 g/dL — ABNORMAL LOW (ref 12.0–15.0)
MCH: 29.3 pg (ref 26.0–34.0)
MCV: 85.5 fL (ref 78.0–100.0)
RBC: 3.72 MIL/uL — ABNORMAL LOW (ref 3.87–5.11)
WBC: 8.6 10*3/uL (ref 4.0–10.5)

## 2013-02-25 NOTE — ED Notes (Signed)
Pt woke up at 10am with generalized chest pressure.  States pain radiates to back.  Reports intermittent episodes of "heart fluttering" and L arm numbness throughout the day.  Also reports sob, diaphoresis, and dizziness.  Around 4pm pt had episode of not being able to remember how to get to a friend's house and did not know how to get back to her house.  Alert and oriented on triage exam.

## 2013-02-26 ENCOUNTER — Emergency Department (HOSPITAL_COMMUNITY): Payer: Medicare Other

## 2013-02-26 ENCOUNTER — Emergency Department (HOSPITAL_COMMUNITY)
Admission: EM | Admit: 2013-02-26 | Discharge: 2013-02-26 | Disposition: A | Discharge status: Home / Self Care | Payer: Medicare Other | Attending: Emergency Medicine | Admitting: Emergency Medicine

## 2013-02-26 DIAGNOSIS — E871 Hypo-osmolality and hyponatremia: Secondary | ICD-10-CM

## 2013-02-26 DIAGNOSIS — R0600 Dyspnea, unspecified: Secondary | ICD-10-CM

## 2013-02-26 DIAGNOSIS — R0789 Other chest pain: Secondary | ICD-10-CM

## 2013-02-26 HISTORY — DX: Bariatric surgery status: Z98.84

## 2013-02-26 HISTORY — DX: Transient cerebral ischemic attack, unspecified: G45.9

## 2013-02-26 HISTORY — DX: Presence of other specified functional implants: Z96.89

## 2013-02-26 LAB — BASIC METABOLIC PANEL
CO2: 26 mEq/L (ref 19–32)
Calcium: 9.1 mg/dL (ref 8.4–10.5)
Chloride: 93 mEq/L — ABNORMAL LOW (ref 96–112)
Creatinine, Ser: 0.99 mg/dL (ref 0.50–1.10)
Glucose, Bld: 89 mg/dL (ref 70–99)

## 2013-02-26 LAB — HEPATIC FUNCTION PANEL
AST: 26 U/L (ref 0–37)
Albumin: 4.1 g/dL (ref 3.5–5.2)
Alkaline Phosphatase: 93 U/L (ref 39–117)
Total Protein: 7 g/dL (ref 6.0–8.3)
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
## 2013-03-05 ENCOUNTER — Other Ambulatory Visit: Payer: Self-pay | Admitting: Family Medicine

## 2013-03-05 DIAGNOSIS — R079 Chest pain, unspecified: Secondary | ICD-10-CM

## 2013-03-06 ENCOUNTER — Ambulatory Visit
Admission: RE | Admit: 2013-03-06 | Discharge: 2013-03-06 | Disposition: A | Discharge status: Home / Self Care | Payer: Medicare Other | Source: Ambulatory Visit | Attending: Family Medicine | Admitting: Family Medicine

## 2013-03-06 DIAGNOSIS — R079 Chest pain, unspecified: Secondary | ICD-10-CM

## 2013-03-06 MED ORDER — IOHEXOL 350 MG/ML SOLN: 125.0000 mL | Freq: Once | INTRAVENOUS | Status: AC | PRN

## 2015-12-12 ENCOUNTER — Ambulatory Visit (INDEPENDENT_AMBULATORY_CARE_PROVIDER_SITE_OTHER): Payer: Medicare Other | Admitting: Gynecology

## 2015-12-12 ENCOUNTER — Encounter: Payer: Self-pay | Admitting: Gynecology

## 2015-12-12 VITALS — BP 136/78 | Ht 66.75 in | Wt 176.0 lb

## 2015-12-12 DIAGNOSIS — Z01419 Encounter for gynecological examination (general) (routine) without abnormal findings: Secondary | ICD-10-CM | POA: Diagnosis not present

## 2015-12-12 NOTE — Progress Notes (Signed)
[name] 1966/03/30 [id number]   History:    50 y.o.  for annual gyn exam is a new patient to the practice. She was referred to our practice as a courtesy of her PCP Dr. Greggory StallionGeorge Osei-Bonsu at [facility] in [place]. She is here for a gynecological exam. He is treating her hypothyroidism, hypertension, depression and iron deficiency anemia. She is in the process of seeing a dermatologist because of thinning of her hair and sebaceous cysts of the scalp. He has been doing all her blood work. Patient several years ago had a vaginal hysterectomy as a result of menorrhagia by another gynecologist in the community. Prior to that she had no history of any abnormal Pap smear. I reviewed her pathology report from her hysterectomy and her cervix had no dysplasia. Patient stated also that she had a stroke at the age of 50 she's never been on any hormone replacement therapy. Her sister had benign colon polyps. Patient had a colonoscopy in 2011.  Past medical history,surgical history, family history and social history were all reviewed and documented in the EPIC chart.  Gynecologic History No LMP recorded. Patient has had a hysterectomy. Contraception: status post hysterectomy Last Pap: Several years ago. Results were: normal Last mammogram: 2014. Results were: normal  Obstetric History OB History  Gravida Para Term Preterm AB SAB TAB Ectopic Multiple Living  2 2        2     # Outcome Date GA Lbr Len/2nd Weight Sex Delivery Anes PTL Lv  2 Para           1 Para                ROS: A ROS was performed and pertinent positives and negatives are included in the history.  GENERAL: No fevers or chills. HEENT: No change in vision, no earache, sore throat or sinus congestion. NECK: No pain or stiffness. CARDIOVASCULAR: No chest pain or pressure. No palpitations. PULMONARY: No shortness of breath, cough or wheeze. GASTROINTESTINAL: No abdominal pain, nausea, vomiting or  diarrhea, melena or bright red blood per rectum. GENITOURINARY: No urinary frequency, urgency, hesitancy or dysuria. MUSCULOSKELETAL: No joint or muscle pain, no back pain, no recent trauma. DERMATOLOGIC: No rash, no itching, no lesions. ENDOCRINE: No polyuria, polydipsia, no heat or cold intolerance. No recent change in weight. HEMATOLOGICAL: No anemia or easy bruising or bleeding. NEUROLOGIC: No headache, seizures, numbness, tingling or weakness. PSYCHIATRIC: No depression, no loss of interest in normal activity or change in sleep pattern.     Exam: chaperone present  BP 136/78 mmHg  Ht 5' 6.75" (1.695 m)  Wt 176 lb (79.833 kg)  BMI 27.79 kg/m2  Body mass index is 27.79 kg/(m^2).  General appearance : Well developed well nourished female. No acute distress HEENT: Eyes: no retinal hemorrhage or exudates,  Neck supple, trachea midline, no carotid bruits, no thyroidmegaly Lungs: Clear to auscultation, no rhonchi or wheezes, or rib retractions  Heart: Regular rate and rhythm, no murmurs or gallops Breast:Examined in sitting and supine position were symmetrical in appearance, no palpable masses or tenderness,  no skin retraction, no nipple inversion, no nipple discharge, no skin discoloration, no axillary or supraclavicular lymphadenopathy Abdomen: no palpable masses or tenderness, no rebound or guarding Extremities: no edema or skin discoloration or tenderness  Pelvic:  Bartholin, Urethra, Skene Glands: Within normal limits             Vagina: No gross lesions or  discharge  Cervix: Absent  Uterus  absent  Adnexa  Without masses or tenderness  Anus and perineum  normal   Rectovaginal  normal sphincter tone without palpated masses or tenderness             Hemoccult not indicated     Assessment/Plan:  50 y.o. female for annual exam with past history of transvaginal hysterectomy as a result of menorrhagia. No past history of any abnormal Pap smear. Pap smear not done today. She was  provided with a requisition for her to call and schedule for her overdue mammogram. I have provided her with literature information and instructions on self breast examination which I encouraged her to do so monthly. We also discussed importance of calcium vitamin D and weightbearing exercises for osteoporosis prevention. PCP has been doing her blood work.   Ok EdwardsFERNANDEZ,Kari Ewing H MD, 3:11 PM 12/12/2015

## 2015-12-12 NOTE — Patient Instructions (Signed)
Breast Self-Awareness Practicing breast self-awareness may pick up problems early, prevent significant medical complications, and possibly save your life. By practicing breast self-awareness, you can become familiar with how your breasts look and feel and if your breasts are changing. This allows you to notice changes early. It can also offer you some reassurance that your breast health is good. One way to learn what is normal for your breasts and whether your breasts are changing is to do a breast self-exam. If you find a lump or something that was not present in the past, it is best to contact your caregiver right away. Other findings that should be evaluated by your caregiver include nipple discharge, especially if it is bloody; skin changes or reddening; areas where the skin seems to be pulled in (retracted); or new lumps and bumps. Breast pain is seldom associated with cancer (malignancy), but should also be evaluated by a caregiver. HOW TO PERFORM A BREAST SELF-EXAM The best time to examine your breasts is 5-7 days after your menstrual period is over. During menstruation, the breasts are lumpier, and it may be more difficult to pick up changes. If you do not menstruate, have reached menopause, or had your uterus removed (hysterectomy), you should examine your breasts at regular intervals, such as monthly. If you are breastfeeding, examine your breasts after a feeding or after using a breast pump. Breast implants do not decrease the risk for lumps or tumors, so continue to perform breast self-exams as recommended. Talk to your caregiver about how to determine the difference between the implant and breast tissue. Also, talk about the amount of pressure you should use during the exam. Over time, you will become more familiar with the variations of your breasts and more comfortable with the exam. A breast self-exam requires you to remove all your clothes above the waist. 1. Look at your breasts and nipples.  Stand in front of a mirror in a room with good lighting. With your hands on your hips, push your hands firmly downward. Look for a difference in shape, contour, and size from one breast to the other (asymmetry). Asymmetry includes puckers, dips, or bumps. Also, look for skin changes, such as reddened or scaly areas on the breasts. Look for nipple changes, such as discharge, dimpling, repositioning, or redness. 2. Carefully feel your breasts. This is best done either in the shower or tub while using soapy water or when flat on your back. Place the arm (on the side of the breast you are examining) above your head. Use the pads (not the fingertips) of your three middle fingers on your opposite hand to feel your breasts. Start in the underarm area and use  inch (2 cm) overlapping circles to feel your breast. Use 3 different levels of pressure (light, medium, and firm pressure) at each circle before moving to the next circle. The light pressure is needed to feel the tissue closest to the skin. The medium pressure will help to feel breast tissue a little deeper, while the firm pressure is needed to feel the tissue close to the ribs. Continue the overlapping circles, moving downward over the breast until you feel your ribs below your breast. Then, move one finger-width towards the center of the body. Continue to use the  inch (2 cm) overlapping circles to feel your breast as you move slowly up toward the collar bone (clavicle) near the base of the neck. Continue the up and down exam using all 3 pressures until you reach the   middle of the chest. Do this with each breast, carefully feeling for lumps or changes. 3.  Keep a written record with breast changes or normal findings for each breast. By writing this information down, you do not need to depend only on memory for size, tenderness, or location. Write down where you are in your menstrual cycle, if you are still menstruating. Breast tissue can have some lumps or  thick tissue. However, see your caregiver if you find anything that concerns you.  SEEK MEDICAL CARE IF:  You see a change in shape, contour, or size of your breasts or nipples.   You see skin changes, such as reddened or scaly areas on the breasts or nipples.   You have an unusual discharge from your nipples.   You feel a new lump or unusually thick areas.    This information is not intended to replace advice given to you by your health care provider. Make sure you discuss any questions you have with your health care provider.   Document Released: 05/24/2005 Document Revised: 05/10/2012 Document Reviewed: 09/08/2011 Elsevier Interactive Patient Education 2016 [employer]  

## 2016-04-22 ENCOUNTER — Other Ambulatory Visit: Payer: Self-pay | Admitting: Pain Medicine

## 2016-04-22 DIAGNOSIS — M545 Low back pain: Secondary | ICD-10-CM

## 2016-05-05 ENCOUNTER — Other Ambulatory Visit: Payer: Medicare Other

## 2016-06-23 ENCOUNTER — Emergency Department (HOSPITAL_COMMUNITY)
Admission: EM | Admit: 2016-06-23 | Discharge: 2016-06-23 | Disposition: A | Discharge status: Home / Self Care | Payer: Medicare Other | Attending: Emergency Medicine | Admitting: Emergency Medicine

## 2016-06-23 ENCOUNTER — Encounter (HOSPITAL_COMMUNITY): Payer: Self-pay

## 2016-06-23 DIAGNOSIS — W268XXA Contact with other sharp object(s), not elsewhere classified, initial encounter: Secondary | ICD-10-CM | POA: Insufficient documentation

## 2016-06-23 DIAGNOSIS — Y929 Unspecified place or not applicable: Secondary | ICD-10-CM | POA: Diagnosis not present

## 2016-06-23 DIAGNOSIS — Y999 Unspecified external cause status: Secondary | ICD-10-CM | POA: Insufficient documentation

## 2016-06-23 DIAGNOSIS — Y939 Activity, unspecified: Secondary | ICD-10-CM | POA: Insufficient documentation

## 2016-06-23 DIAGNOSIS — S61111A Laceration without foreign body of right thumb with damage to nail, initial encounter: Secondary | ICD-10-CM

## 2016-06-23 DIAGNOSIS — Z8673 Personal history of transient ischemic attack (TIA), and cerebral infarction without residual deficits: Secondary | ICD-10-CM | POA: Diagnosis not present

## 2016-06-23 DIAGNOSIS — F909 Attention-deficit hyperactivity disorder, unspecified type: Secondary | ICD-10-CM | POA: Insufficient documentation

## 2016-06-23 DIAGNOSIS — Z23 Encounter for immunization: Secondary | ICD-10-CM | POA: Insufficient documentation

## 2016-06-23 DIAGNOSIS — Z7982 Long term (current) use of aspirin: Secondary | ICD-10-CM | POA: Diagnosis not present

## 2016-06-23 MED ORDER — TETANUS-DIPHTH-ACELL PERTUSSIS 5-2.5-18.5 LF-MCG/0.5 IM SUSP
0.5000 mL | Freq: Once | INTRAMUSCULAR | Status: AC
  Administered 2016-06-23: 0.5 mL via INTRAMUSCULAR
  Filled 2016-06-23: qty 0.5

## 2016-06-23 NOTE — ED Triage Notes (Signed)
Pt states cutting carrots about an hour ago and cut rt thumb.  Bleeding controlled

## 2016-06-23 NOTE — ED Provider Notes (Signed)
[facility]-EMERGENCY DEPT Provider Note   CSN: [id number] Arrival date & time: 06/23/16  1342  By signing my name below, I, Soijett Blue, attest that this documentation has been prepared under the direction and in the presence of Bethel BornKelly Marie Ikeisha Blumberg, PA-C Electronically Signed: Soijett Blue, ED Scribe. 06/23/16. 2:11 PM.  History   Chief Complaint Chief Complaint  Patient presents with  . Extremity Laceration    HPI [name] is a 51 y.o. female who presents to the Emergency Department complaining of laceration onset 1 hour ago. Pt notes that she was using a mandolin to slice carrots when she accidentally cut her right thumb. Pt reports that she takes 81 mg ASA daily. Pt is unsure if she is UTD with her tetanus vaccination. She is having associated symptoms of mild right thumb pain. She has tried applying pressure without medications with relief of her symptoms. She denies color change, swelling, and any other symptoms.    The history is provided by the patient. No language interpreter was used.    Past Medical History:  Diagnosis Date  . ADD (attention deficit disorder)   . Depression   . History of gastric bypass   . Neuropathy (HCC)   . Spinal cord stimulator status   . TIA (transient ischemic attack)     Patient Active Problem List   Diagnosis Date Noted  . UNSPECIFIED TACHYCARDIA 01/14/2010  . SHORTNESS OF BREATH 01/14/2010  . PAROXYSMAL SUPRAVENTRICULAR TACHYCARDIA 12/22/2009    Past Surgical History:  Procedure Laterality Date  . ABDOMINAL HYSTERECTOMY    . ANKLE SURGERY Left   . BACK SURGERY    . CHOLECYSTECTOMY    . EYE SURGERY    . GASTRIC BYPASS    . TONSILLECTOMY      OB History    Gravida Para Term Preterm AB Living   2 2       2    SAB TAB Ectopic Multiple Live Births                   Home Medications    Prior to Admission medications   Medication Sig Start Date End Date Taking? Authorizing Provider  amLODipine (NORVASC) 5 MG tablet Take 10 mg  by mouth daily.     Historical Provider, MD  amphetamine-dextroamphetamine (ADDERALL) 20 MG tablet Take 20 mg by mouth 3 (three) times daily.    Historical Provider, MD  ARIPiprazole (ABILIFY) 5 MG tablet Take 5 mg by mouth 2 (two) times daily.     Historical Provider, MD  aspirin EC 81 MG tablet Take 81 mg by mouth daily.    Historical Provider, MD  cyclobenzaprine (FLEXERIL) 10 MG tablet Take 1 tablet (10 mg total) by mouth 2 (two) times daily as needed for muscle spasms. Patient not taking: Reported on 12/12/2015 06/19/12   Jaynie Crumbleatyana Kirichenko, PA-C  DULoxetine (CYMBALTA) 60 MG capsule Take 60 mg by mouth daily.    Historical Provider, MD  fentaNYL (DURAGESIC - DOSED MCG/HR) 50 MCG/HR Place 1 patch onto the skin every other day.     Historical Provider, MD  gabapentin (NEURONTIN) 800 MG tablet Take 800 mg by mouth 4 (four) times daily.     Historical Provider, MD  HYDROcodone-acetaminophen (NORCO) 10-325 MG per tablet Take 1 tablet by mouth every 6 (six) hours as needed for pain.    Historical Provider, MD  ibuprofen (ADVIL,MOTRIN) 200 MG tablet Take 400-600 mg by mouth every 6 (six) hours as needed for pain.  Historical Provider, MD  levothyroxine (SYNTHROID, LEVOTHROID) 25 MCG tablet Take 25 mcg by mouth daily before breakfast.    Historical Provider, MD  lidocaine (LIDODERM) 5 % Place 1 patch onto the skin daily as needed (pain). Remove & Discard patch within 12 hours or as directed by MD    Historical Provider, MD  lidocaine (XYLOCAINE) 2 % solution Take 15 mLs by mouth as needed for pain.    Historical Provider, MD  losartan (COZAAR) 100 MG tablet Take 100 mg by mouth daily.    Historical Provider, MD  nortriptyline (PAMELOR) 75 MG capsule Take 150 mg by mouth at bedtime.    Historical Provider, MD    Family History Family History  Problem Relation Age of Onset  . Cancer Mother     MELANOMA  . Cancer Father     THYROID  . Hypertension Father   . Diabetes Maternal Aunt   . Heart  disease Maternal Aunt   . Diabetes Paternal Uncle   . Cancer Maternal Grandmother     LUNG- SMOKER  . Cancer Maternal Grandfather     BONE   . Cancer Paternal Grandmother     SKIN- BASAL CELL    Social History Social History  Substance Use Topics  . Smoking status: Never Smoker  . Smokeless tobacco: Not on file  . Alcohol use 0.0 oz/week     Comment: OCC     Allergies   Lactose intolerance (gi); Other; Apple fruit extract; Iodine; and Shellfish allergy   Review of Systems Review of Systems  Musculoskeletal: Positive for arthralgias (right thumb). Negative for joint swelling.  Skin: Positive for wound (laceration to right thumb). Negative for color change.     Physical Exam Updated Vital Signs BP (!) 152/106 (BP Location: Left Arm)   Pulse 96   Temp 98.1 F (36.7 C) (Oral)   Resp 18   SpO2 98%   Physical Exam  Constitutional: She is oriented to person, place, and time. She appears well-developed and well-nourished. No distress.  HENT:  Head: Normocephalic and atraumatic.  Eyes: EOM are normal.  Neck: Neck supple.  Cardiovascular: Normal rate.   Pulmonary/Chest: Effort normal. No respiratory distress.  Abdominal: She exhibits no distension.  Musculoskeletal: Normal range of motion.  Right thumb with jagged V-shaped laceration over distal volar thumb. Lateral aspect of nail has been cut as well. Nail bed intact. FROM of IP joint. Bleeding controlled.   Neurological: She is alert and oriented to person, place, and time.  Skin: Skin is warm and dry.  Psychiatric: She has a normal mood and affect. Her behavior is normal.  Nursing note and vitals reviewed.    ED Treatments / Results  DIAGNOSTIC STUDIES: Oxygen Saturation is 98% on RA, nl by my interpretation.    COORDINATION OF CARE: 2:07 PM Discussed treatment plan with pt at bedside which includes wound care, update tetanus vaccination, and pt agreed to plan.  Procedures Procedures (including critical care  time)  Medications Ordered in ED Medications  Tdap (BOOSTRIX) injection 0.5 mL (0.5 mLs Intramuscular Given 06/23/16 1453)     Initial Impression / Assessment and Plan / ED Course  I have reviewed the triage vital signs and the nursing notes.   Clinical Course    51 year old with laceration to right thumb with minimal nail involvement. Tetanus updated in the ED. Laceration occurred < 12 hours prior to repair. Do not feel suturing will be beneficial at this time since bleeding is controlled  and laceration is minimal. Wound was copiously irrigated. Dressing applied. Wound care discussed. Pt is hemodynamically stable with no complaints prior to dc.    Final Clinical Impressions(s) / ED Diagnoses   Final diagnoses:  Laceration of right thumb without foreign body with damage to nail, initial encounter    New Prescriptions New Prescriptions   No medications on file   I personally performed the services described in this documentation, which was scribed in my presence. The recorded information has been reviewed and is accurate.     Bethel Born, PA-C 06/23/16 1613    Azalia Bilis, MD 06/26/16 [phone]

## 2016-06-23 NOTE — Discharge Instructions (Signed)
Clean wound daily and change bandage if it gets dirty. Follow up for signs of infection

## 2016-06-23 NOTE — ED Notes (Signed)
Pt ambulatory and independent at discharge.  Verbalized understanding of discharge instructions 

## 2016-08-13 ENCOUNTER — Other Ambulatory Visit: Payer: Self-pay | Admitting: Anesthesiology

## 2016-08-13 ENCOUNTER — Ambulatory Visit
Admission: RE | Admit: 2016-08-13 | Discharge: 2016-08-13 | Disposition: A | Discharge status: Home / Self Care | Payer: Medicare Other | Source: Ambulatory Visit | Attending: Anesthesiology | Admitting: Anesthesiology

## 2016-08-13 DIAGNOSIS — M546 Pain in thoracic spine: Secondary | ICD-10-CM

## 2016-08-13 DIAGNOSIS — M79672 Pain in left foot: Secondary | ICD-10-CM

## 2016-08-13 DIAGNOSIS — M545 Low back pain, unspecified: Secondary | ICD-10-CM

## 2016-10-20 ENCOUNTER — Encounter: Payer: Self-pay | Admitting: Gynecology

## 2016-12-13 ENCOUNTER — Encounter: Payer: Medicare Other | Admitting: Gynecology

## 2016-12-13 DIAGNOSIS — Z0289 Encounter for other administrative examinations: Secondary | ICD-10-CM

## 2017-02-01 ENCOUNTER — Other Ambulatory Visit: Payer: Self-pay | Admitting: Physician Assistant

## 2017-02-01 DIAGNOSIS — M79605 Pain in left leg: Secondary | ICD-10-CM

## 2017-02-14 ENCOUNTER — Other Ambulatory Visit: Payer: Medicare Other

## 2017-04-20 ENCOUNTER — Other Ambulatory Visit: Payer: Self-pay | Admitting: Pain Medicine

## 2017-04-20 DIAGNOSIS — M545 Low back pain: Secondary | ICD-10-CM

## 2017-05-09 ENCOUNTER — Other Ambulatory Visit: Payer: Self-pay | Admitting: Internal Medicine

## 2017-06-08 DIAGNOSIS — R1013 Epigastric pain: Secondary | ICD-10-CM | POA: Diagnosis not present

## 2017-06-08 DIAGNOSIS — G894 Chronic pain syndrome: Secondary | ICD-10-CM | POA: Diagnosis not present

## 2017-06-08 DIAGNOSIS — I119 Hypertensive heart disease without heart failure: Secondary | ICD-10-CM | POA: Diagnosis not present

## 2017-06-08 DIAGNOSIS — G629 Polyneuropathy, unspecified: Secondary | ICD-10-CM | POA: Diagnosis not present

## 2017-06-08 DIAGNOSIS — I10 Essential (primary) hypertension: Secondary | ICD-10-CM | POA: Diagnosis not present

## 2017-06-08 DIAGNOSIS — E039 Hypothyroidism, unspecified: Secondary | ICD-10-CM | POA: Diagnosis not present

## 2017-06-08 DIAGNOSIS — H05011 Cellulitis of right orbit: Secondary | ICD-10-CM | POA: Diagnosis not present

## 2017-06-08 DIAGNOSIS — D509 Iron deficiency anemia, unspecified: Secondary | ICD-10-CM | POA: Diagnosis not present

## 2017-06-08 DIAGNOSIS — R69 Illness, unspecified: Secondary | ICD-10-CM | POA: Diagnosis not present

## 2017-06-13 DIAGNOSIS — R1013 Epigastric pain: Secondary | ICD-10-CM | POA: Diagnosis not present

## 2017-06-13 DIAGNOSIS — D509 Iron deficiency anemia, unspecified: Secondary | ICD-10-CM | POA: Diagnosis not present

## 2017-06-13 DIAGNOSIS — E039 Hypothyroidism, unspecified: Secondary | ICD-10-CM | POA: Diagnosis not present

## 2017-06-13 DIAGNOSIS — G629 Polyneuropathy, unspecified: Secondary | ICD-10-CM | POA: Diagnosis not present

## 2017-06-13 DIAGNOSIS — I119 Hypertensive heart disease without heart failure: Secondary | ICD-10-CM | POA: Diagnosis not present

## 2017-06-13 DIAGNOSIS — H05011 Cellulitis of right orbit: Secondary | ICD-10-CM | POA: Diagnosis not present

## 2017-06-13 DIAGNOSIS — R69 Illness, unspecified: Secondary | ICD-10-CM | POA: Diagnosis not present

## 2017-06-13 DIAGNOSIS — G894 Chronic pain syndrome: Secondary | ICD-10-CM | POA: Diagnosis not present

## 2017-06-13 DIAGNOSIS — I10 Essential (primary) hypertension: Secondary | ICD-10-CM | POA: Diagnosis not present

## 2017-07-04 DIAGNOSIS — M47817 Spondylosis without myelopathy or radiculopathy, lumbosacral region: Secondary | ICD-10-CM | POA: Diagnosis not present

## 2017-07-04 DIAGNOSIS — M47816 Spondylosis without myelopathy or radiculopathy, lumbar region: Secondary | ICD-10-CM | POA: Diagnosis not present

## 2017-07-04 DIAGNOSIS — Z79899 Other long term (current) drug therapy: Secondary | ICD-10-CM | POA: Diagnosis not present

## 2017-07-04 DIAGNOSIS — G894 Chronic pain syndrome: Secondary | ICD-10-CM | POA: Diagnosis not present

## 2017-07-04 DIAGNOSIS — M25559 Pain in unspecified hip: Secondary | ICD-10-CM | POA: Diagnosis not present

## 2017-07-04 DIAGNOSIS — Z79891 Long term (current) use of opiate analgesic: Secondary | ICD-10-CM | POA: Diagnosis not present

## 2017-07-12 DIAGNOSIS — R69 Illness, unspecified: Secondary | ICD-10-CM | POA: Diagnosis not present

## 2017-07-12 DIAGNOSIS — L03211 Cellulitis of face: Secondary | ICD-10-CM | POA: Diagnosis not present

## 2017-07-12 DIAGNOSIS — D509 Iron deficiency anemia, unspecified: Secondary | ICD-10-CM | POA: Diagnosis not present

## 2017-07-12 DIAGNOSIS — E039 Hypothyroidism, unspecified: Secondary | ICD-10-CM | POA: Diagnosis not present

## 2017-07-12 DIAGNOSIS — I10 Essential (primary) hypertension: Secondary | ICD-10-CM | POA: Diagnosis not present

## 2017-07-12 DIAGNOSIS — G894 Chronic pain syndrome: Secondary | ICD-10-CM | POA: Diagnosis not present

## 2017-07-12 DIAGNOSIS — G629 Polyneuropathy, unspecified: Secondary | ICD-10-CM | POA: Diagnosis not present

## 2017-07-12 DIAGNOSIS — I119 Hypertensive heart disease without heart failure: Secondary | ICD-10-CM | POA: Diagnosis not present

## 2017-07-12 DIAGNOSIS — R1013 Epigastric pain: Secondary | ICD-10-CM | POA: Diagnosis not present

## 2017-08-01 DIAGNOSIS — M25559 Pain in unspecified hip: Secondary | ICD-10-CM | POA: Diagnosis not present

## 2017-08-01 DIAGNOSIS — G629 Polyneuropathy, unspecified: Secondary | ICD-10-CM | POA: Diagnosis not present

## 2017-08-01 DIAGNOSIS — G894 Chronic pain syndrome: Secondary | ICD-10-CM | POA: Diagnosis not present

## 2017-08-01 DIAGNOSIS — M47816 Spondylosis without myelopathy or radiculopathy, lumbar region: Secondary | ICD-10-CM | POA: Diagnosis not present

## 2017-08-01 DIAGNOSIS — Z79891 Long term (current) use of opiate analgesic: Secondary | ICD-10-CM | POA: Diagnosis not present

## 2017-08-01 DIAGNOSIS — Z79899 Other long term (current) drug therapy: Secondary | ICD-10-CM | POA: Diagnosis not present

## 2017-08-12 DIAGNOSIS — F909 Attention-deficit hyperactivity disorder, unspecified type: Secondary | ICD-10-CM | POA: Diagnosis not present

## 2017-08-12 DIAGNOSIS — R69 Illness, unspecified: Secondary | ICD-10-CM | POA: Diagnosis not present

## 2017-08-12 DIAGNOSIS — F411 Generalized anxiety disorder: Secondary | ICD-10-CM | POA: Diagnosis not present

## 2017-08-29 DIAGNOSIS — M5137 Other intervertebral disc degeneration, lumbosacral region: Secondary | ICD-10-CM | POA: Diagnosis not present

## 2017-08-29 DIAGNOSIS — G894 Chronic pain syndrome: Secondary | ICD-10-CM | POA: Diagnosis not present

## 2017-08-29 DIAGNOSIS — M47817 Spondylosis without myelopathy or radiculopathy, lumbosacral region: Secondary | ICD-10-CM | POA: Diagnosis not present

## 2017-08-29 DIAGNOSIS — Z79899 Other long term (current) drug therapy: Secondary | ICD-10-CM | POA: Diagnosis not present

## 2017-08-29 DIAGNOSIS — Z79891 Long term (current) use of opiate analgesic: Secondary | ICD-10-CM | POA: Diagnosis not present

## 2017-08-29 DIAGNOSIS — M47816 Spondylosis without myelopathy or radiculopathy, lumbar region: Secondary | ICD-10-CM | POA: Diagnosis not present

## 2017-08-29 DIAGNOSIS — G629 Polyneuropathy, unspecified: Secondary | ICD-10-CM | POA: Diagnosis not present

## 2017-09-22 DIAGNOSIS — Z79891 Long term (current) use of opiate analgesic: Secondary | ICD-10-CM | POA: Diagnosis not present

## 2017-09-22 DIAGNOSIS — M545 Low back pain: Secondary | ICD-10-CM | POA: Diagnosis not present

## 2017-09-22 DIAGNOSIS — G629 Polyneuropathy, unspecified: Secondary | ICD-10-CM | POA: Diagnosis not present

## 2017-09-22 DIAGNOSIS — G8929 Other chronic pain: Secondary | ICD-10-CM | POA: Diagnosis not present

## 2017-09-26 DIAGNOSIS — G894 Chronic pain syndrome: Secondary | ICD-10-CM | POA: Diagnosis not present

## 2017-09-26 DIAGNOSIS — M47816 Spondylosis without myelopathy or radiculopathy, lumbar region: Secondary | ICD-10-CM | POA: Diagnosis not present

## 2017-09-26 DIAGNOSIS — M5137 Other intervertebral disc degeneration, lumbosacral region: Secondary | ICD-10-CM | POA: Diagnosis not present

## 2017-09-26 DIAGNOSIS — G629 Polyneuropathy, unspecified: Secondary | ICD-10-CM | POA: Diagnosis not present

## 2017-10-11 DIAGNOSIS — R200 Anesthesia of skin: Secondary | ICD-10-CM | POA: Diagnosis not present

## 2017-10-13 DIAGNOSIS — H5319 Other subjective visual disturbances: Secondary | ICD-10-CM | POA: Diagnosis not present

## 2017-10-24 DIAGNOSIS — M47817 Spondylosis without myelopathy or radiculopathy, lumbosacral region: Secondary | ICD-10-CM | POA: Diagnosis not present

## 2017-10-24 DIAGNOSIS — G629 Polyneuropathy, unspecified: Secondary | ICD-10-CM | POA: Diagnosis not present

## 2017-10-24 DIAGNOSIS — Z79899 Other long term (current) drug therapy: Secondary | ICD-10-CM | POA: Diagnosis not present

## 2017-10-24 DIAGNOSIS — Z79891 Long term (current) use of opiate analgesic: Secondary | ICD-10-CM | POA: Diagnosis not present

## 2017-10-24 DIAGNOSIS — G894 Chronic pain syndrome: Secondary | ICD-10-CM | POA: Diagnosis not present

## 2017-10-24 DIAGNOSIS — M533 Sacrococcygeal disorders, not elsewhere classified: Secondary | ICD-10-CM | POA: Diagnosis not present

## 2017-11-10 DIAGNOSIS — G629 Polyneuropathy, unspecified: Secondary | ICD-10-CM | POA: Diagnosis not present

## 2017-11-10 DIAGNOSIS — G5600 Carpal tunnel syndrome, unspecified upper limb: Secondary | ICD-10-CM | POA: Diagnosis not present

## 2017-11-21 DIAGNOSIS — G894 Chronic pain syndrome: Secondary | ICD-10-CM | POA: Diagnosis not present

## 2017-11-21 DIAGNOSIS — M5137 Other intervertebral disc degeneration, lumbosacral region: Secondary | ICD-10-CM | POA: Diagnosis not present

## 2017-11-21 DIAGNOSIS — G629 Polyneuropathy, unspecified: Secondary | ICD-10-CM | POA: Diagnosis not present

## 2017-11-21 DIAGNOSIS — G5600 Carpal tunnel syndrome, unspecified upper limb: Secondary | ICD-10-CM | POA: Diagnosis not present

## 2017-11-22 DIAGNOSIS — H5034 Intermittent alternating exotropia: Secondary | ICD-10-CM | POA: Diagnosis not present

## 2017-11-22 DIAGNOSIS — H5021 Vertical strabismus, right eye: Secondary | ICD-10-CM | POA: Diagnosis not present

## 2017-12-19 DIAGNOSIS — G5600 Carpal tunnel syndrome, unspecified upper limb: Secondary | ICD-10-CM | POA: Diagnosis not present

## 2017-12-19 DIAGNOSIS — M47817 Spondylosis without myelopathy or radiculopathy, lumbosacral region: Secondary | ICD-10-CM | POA: Diagnosis not present

## 2017-12-19 DIAGNOSIS — M533 Sacrococcygeal disorders, not elsewhere classified: Secondary | ICD-10-CM | POA: Diagnosis not present

## 2017-12-19 DIAGNOSIS — G894 Chronic pain syndrome: Secondary | ICD-10-CM | POA: Diagnosis not present

## 2017-12-19 DIAGNOSIS — Z79891 Long term (current) use of opiate analgesic: Secondary | ICD-10-CM | POA: Diagnosis not present

## 2017-12-19 DIAGNOSIS — Z79899 Other long term (current) drug therapy: Secondary | ICD-10-CM | POA: Diagnosis not present

## 2018-01-11 DIAGNOSIS — G894 Chronic pain syndrome: Secondary | ICD-10-CM | POA: Diagnosis not present

## 2018-01-11 DIAGNOSIS — M5137 Other intervertebral disc degeneration, lumbosacral region: Secondary | ICD-10-CM | POA: Diagnosis not present

## 2018-01-11 DIAGNOSIS — G5600 Carpal tunnel syndrome, unspecified upper limb: Secondary | ICD-10-CM | POA: Diagnosis not present

## 2018-01-11 DIAGNOSIS — M47817 Spondylosis without myelopathy or radiculopathy, lumbosacral region: Secondary | ICD-10-CM | POA: Diagnosis not present

## 2018-01-24 DIAGNOSIS — R69 Illness, unspecified: Secondary | ICD-10-CM | POA: Diagnosis not present

## 2018-01-24 DIAGNOSIS — F411 Generalized anxiety disorder: Secondary | ICD-10-CM | POA: Diagnosis not present

## 2018-01-24 DIAGNOSIS — F909 Attention-deficit hyperactivity disorder, unspecified type: Secondary | ICD-10-CM | POA: Diagnosis not present

## 2018-02-08 DIAGNOSIS — I10 Essential (primary) hypertension: Secondary | ICD-10-CM | POA: Diagnosis not present

## 2018-02-08 DIAGNOSIS — Z0000 Encounter for general adult medical examination without abnormal findings: Secondary | ICD-10-CM | POA: Diagnosis not present

## 2018-02-08 DIAGNOSIS — D509 Iron deficiency anemia, unspecified: Secondary | ICD-10-CM | POA: Diagnosis not present

## 2018-02-08 DIAGNOSIS — I119 Hypertensive heart disease without heart failure: Secondary | ICD-10-CM | POA: Diagnosis not present

## 2018-02-08 DIAGNOSIS — E039 Hypothyroidism, unspecified: Secondary | ICD-10-CM | POA: Diagnosis not present

## 2018-02-08 DIAGNOSIS — R1013 Epigastric pain: Secondary | ICD-10-CM | POA: Diagnosis not present

## 2018-02-08 DIAGNOSIS — K039 Disease of hard tissues of teeth, unspecified: Secondary | ICD-10-CM | POA: Diagnosis not present

## 2018-02-08 DIAGNOSIS — G629 Polyneuropathy, unspecified: Secondary | ICD-10-CM | POA: Diagnosis not present

## 2018-02-08 DIAGNOSIS — G894 Chronic pain syndrome: Secondary | ICD-10-CM | POA: Diagnosis not present

## 2018-02-08 DIAGNOSIS — R3129 Other microscopic hematuria: Secondary | ICD-10-CM | POA: Diagnosis not present

## 2018-02-08 DIAGNOSIS — R600 Localized edema: Secondary | ICD-10-CM | POA: Diagnosis not present

## 2018-02-08 DIAGNOSIS — R69 Illness, unspecified: Secondary | ICD-10-CM | POA: Diagnosis not present

## 2018-02-10 DIAGNOSIS — I119 Hypertensive heart disease without heart failure: Secondary | ICD-10-CM | POA: Diagnosis not present

## 2018-02-10 DIAGNOSIS — I10 Essential (primary) hypertension: Secondary | ICD-10-CM | POA: Diagnosis not present

## 2018-02-10 DIAGNOSIS — R609 Edema, unspecified: Secondary | ICD-10-CM | POA: Diagnosis not present

## 2018-02-13 ENCOUNTER — Other Ambulatory Visit: Payer: Self-pay | Admitting: Physician Assistant

## 2018-02-13 DIAGNOSIS — Z6827 Body mass index (BMI) 27.0-27.9, adult: Secondary | ICD-10-CM | POA: Diagnosis not present

## 2018-02-13 DIAGNOSIS — Z01419 Encounter for gynecological examination (general) (routine) without abnormal findings: Secondary | ICD-10-CM | POA: Diagnosis not present

## 2018-02-13 DIAGNOSIS — Z1231 Encounter for screening mammogram for malignant neoplasm of breast: Secondary | ICD-10-CM

## 2018-02-15 DIAGNOSIS — G894 Chronic pain syndrome: Secondary | ICD-10-CM | POA: Diagnosis not present

## 2018-02-15 DIAGNOSIS — M5136 Other intervertebral disc degeneration, lumbar region: Secondary | ICD-10-CM | POA: Diagnosis not present

## 2018-02-15 DIAGNOSIS — M47817 Spondylosis without myelopathy or radiculopathy, lumbosacral region: Secondary | ICD-10-CM | POA: Diagnosis not present

## 2018-02-17 ENCOUNTER — Encounter: Payer: Self-pay | Admitting: Radiology

## 2018-02-17 DIAGNOSIS — Z9689 Presence of other specified functional implants: Secondary | ICD-10-CM | POA: Insufficient documentation

## 2018-02-26 ENCOUNTER — Encounter (HOSPITAL_COMMUNITY): Payer: Self-pay | Admitting: *Deleted

## 2018-02-26 ENCOUNTER — Other Ambulatory Visit: Payer: Self-pay

## 2018-02-26 ENCOUNTER — Emergency Department (HOSPITAL_COMMUNITY): Payer: Medicare HMO

## 2018-02-26 ENCOUNTER — Observation Stay (HOSPITAL_COMMUNITY)
Admission: EM | Admit: 2018-02-26 | Discharge: 2018-02-28 | Disposition: A | Discharge status: Home / Self Care | Payer: Medicare HMO | Attending: Internal Medicine | Admitting: Internal Medicine

## 2018-02-26 DIAGNOSIS — G629 Polyneuropathy, unspecified: Secondary | ICD-10-CM | POA: Diagnosis not present

## 2018-02-26 DIAGNOSIS — I700 Atherosclerosis of aorta: Secondary | ICD-10-CM | POA: Diagnosis not present

## 2018-02-26 DIAGNOSIS — F329 Major depressive disorder, single episode, unspecified: Secondary | ICD-10-CM | POA: Insufficient documentation

## 2018-02-26 DIAGNOSIS — Z79899 Other long term (current) drug therapy: Secondary | ICD-10-CM | POA: Insufficient documentation

## 2018-02-26 DIAGNOSIS — I959 Hypotension, unspecified: Secondary | ICD-10-CM | POA: Insufficient documentation

## 2018-02-26 DIAGNOSIS — Z9049 Acquired absence of other specified parts of digestive tract: Secondary | ICD-10-CM | POA: Diagnosis not present

## 2018-02-26 DIAGNOSIS — G894 Chronic pain syndrome: Secondary | ICD-10-CM | POA: Insufficient documentation

## 2018-02-26 DIAGNOSIS — A09 Infectious gastroenteritis and colitis, unspecified: Secondary | ICD-10-CM

## 2018-02-26 DIAGNOSIS — E860 Dehydration: Secondary | ICD-10-CM | POA: Insufficient documentation

## 2018-02-26 DIAGNOSIS — Z7982 Long term (current) use of aspirin: Secondary | ICD-10-CM | POA: Insufficient documentation

## 2018-02-26 DIAGNOSIS — Z888 Allergy status to other drugs, medicaments and biological substances status: Secondary | ICD-10-CM | POA: Diagnosis not present

## 2018-02-26 DIAGNOSIS — K529 Noninfective gastroenteritis and colitis, unspecified: Principal | ICD-10-CM | POA: Diagnosis present

## 2018-02-26 DIAGNOSIS — E039 Hypothyroidism, unspecified: Secondary | ICD-10-CM | POA: Insufficient documentation

## 2018-02-26 DIAGNOSIS — Z8673 Personal history of transient ischemic attack (TIA), and cerebral infarction without residual deficits: Secondary | ICD-10-CM | POA: Insufficient documentation

## 2018-02-26 DIAGNOSIS — Z9884 Bariatric surgery status: Secondary | ICD-10-CM | POA: Diagnosis not present

## 2018-02-26 DIAGNOSIS — R69 Illness, unspecified: Secondary | ICD-10-CM | POA: Diagnosis not present

## 2018-02-26 DIAGNOSIS — E876 Hypokalemia: Secondary | ICD-10-CM | POA: Diagnosis not present

## 2018-02-26 DIAGNOSIS — R109 Unspecified abdominal pain: Secondary | ICD-10-CM | POA: Diagnosis not present

## 2018-02-26 DIAGNOSIS — K829 Disease of gallbladder, unspecified: Secondary | ICD-10-CM | POA: Diagnosis not present

## 2018-02-26 DIAGNOSIS — F419 Anxiety disorder, unspecified: Secondary | ICD-10-CM | POA: Diagnosis not present

## 2018-02-26 DIAGNOSIS — R197 Diarrhea, unspecified: Secondary | ICD-10-CM | POA: Diagnosis not present

## 2018-02-26 LAB — URINALYSIS, ROUTINE W REFLEX MICROSCOPIC
Bilirubin Urine: NEGATIVE
GLUCOSE, UA: NEGATIVE mg/dL
Ketones, ur: 5 mg/dL — AB
LEUKOCYTES UA: NEGATIVE
NITRITE: NEGATIVE
Protein, ur: NEGATIVE mg/dL
SPECIFIC GRAVITY, URINE: 1.012 (ref 1.005–1.030)
pH: 5 (ref 5.0–8.0)

## 2018-02-26 LAB — COMPREHENSIVE METABOLIC PANEL
ALT: 11 U/L (ref 0–44)
AST: 20 U/L (ref 15–41)
Albumin: 3.9 g/dL (ref 3.5–5.0)
Alkaline Phosphatase: 121 U/L (ref 38–126)
Anion gap: 14 (ref 5–15)
BUN: 9 mg/dL (ref 6–20)
CHLORIDE: 97 mmol/L — AB (ref 98–111)
CO2: 25 mmol/L (ref 22–32)
Calcium: 9.7 mg/dL (ref 8.9–10.3)
Creatinine, Ser: 0.92 mg/dL (ref 0.44–1.00)
GFR calc Af Amer: >60 mL/min (ref >60)
GFR calc non Af Amer: >60 mL/min (ref >60)
Glucose, Bld: 103 mg/dL — ABNORMAL HIGH (ref 70–99)
Potassium: 3.6 mmol/L (ref 3.5–5.1)
Sodium: 136 mmol/L (ref 135–145)
Total Bilirubin: 0.6 mg/dL (ref 0.3–1.2)
Total Protein: 7.3 g/dL (ref 6.5–8.1)

## 2018-02-26 LAB — CBC
HEMATOCRIT: 41.1 % (ref 36.0–46.0)
Hemoglobin: 13 g/dL (ref 12.0–15.0)
MCH: 28.1 pg (ref 26.0–34.0)
MCHC: 31.6 g/dL (ref 30.0–36.0)
MCV: 88.8 fL (ref 78.0–100.0)
Platelets: 379 10*3/uL (ref 150–400)
RBC: 4.63 MIL/uL (ref 3.87–5.11)
RDW: 12.2 % (ref 11.5–15.5)
WBC: 7.7 10*3/uL (ref 4.0–10.5)

## 2018-02-26 LAB — LIPASE, BLOOD: Lipase: 20 U/L (ref 11–51)

## 2018-02-26 MED ORDER — LOSARTAN POTASSIUM 50 MG PO TABS
100.0000 mg | ORAL_TABLET | Freq: Every day | ORAL | Status: DC
  Administered 2018-02-26 – 2018-02-27 (×2): 100 mg via ORAL
  Filled 2018-02-26 (×2): qty 2

## 2018-02-26 MED ORDER — METRONIDAZOLE 500 MG PO TABS
500.0000 mg | ORAL_TABLET | Freq: Three times a day (TID) | ORAL | Status: DC
  Administered 2018-02-26 – 2018-02-28 (×5): 500 mg via ORAL
  Filled 2018-02-26 (×5): qty 1

## 2018-02-26 MED ORDER — GABAPENTIN 400 MG PO CAPS
800.0000 mg | ORAL_CAPSULE | Freq: Four times a day (QID) | ORAL | Status: DC
  Administered 2018-02-26 – 2018-02-28 (×6): 800 mg via ORAL
  Filled 2018-02-26 (×6): qty 2

## 2018-02-26 MED ORDER — LEVOTHYROXINE SODIUM 25 MCG PO TABS
25.0000 ug | ORAL_TABLET | Freq: Every day | ORAL | Status: DC
  Administered 2018-02-27 – 2018-02-28 (×2): 25 ug via ORAL
  Filled 2018-02-26 (×2): qty 1

## 2018-02-26 MED ORDER — SODIUM CHLORIDE 0.9 % IV BOLUS
1000.0000 mL | Freq: Once | INTRAVENOUS | Status: AC
  Administered 2018-02-26: 1000 mL via INTRAVENOUS

## 2018-02-26 MED ORDER — CIPROFLOXACIN HCL 500 MG PO TABS
500.0000 mg | ORAL_TABLET | Freq: Two times a day (BID) | ORAL | Status: DC
  Administered 2018-02-26 – 2018-02-28 (×4): 500 mg via ORAL
  Filled 2018-02-26 (×4): qty 1

## 2018-02-26 MED ORDER — ONDANSETRON HCL 4 MG/2ML IJ SOLN
4.0000 mg | Freq: Once | INTRAMUSCULAR | Status: AC
  Administered 2018-02-26: 4 mg via INTRAVENOUS
  Filled 2018-02-26: qty 2

## 2018-02-26 MED ORDER — OXYCODONE HCL 5 MG PO TABS
5.0000 mg | ORAL_TABLET | Freq: Four times a day (QID) | ORAL | Status: DC | PRN
  Administered 2018-02-26 – 2018-02-28 (×3): 5 mg via ORAL
  Filled 2018-02-26 (×3): qty 1

## 2018-02-26 MED ORDER — DIPHENHYDRAMINE HCL 50 MG/ML IJ SOLN
50.0000 mg | Freq: Once | INTRAMUSCULAR | Status: AC
  Administered 2018-02-26: 50 mg via INTRAVENOUS
  Filled 2018-02-26: qty 1

## 2018-02-26 MED ORDER — LACTATED RINGERS IV SOLN
INTRAVENOUS | Status: AC
  Administered 2018-02-26: 23:00:00 via INTRAVENOUS

## 2018-02-26 MED ORDER — OXYCODONE-ACETAMINOPHEN 10-325 MG PO TABS: 1.0000 | ORAL_TABLET | Freq: Four times a day (QID) | ORAL | Status: DC | PRN

## 2018-02-26 MED ORDER — DULOXETINE HCL 60 MG PO CPEP
60.0000 mg | ORAL_CAPSULE | Freq: Every day | ORAL | Status: DC
  Administered 2018-02-26 – 2018-02-28 (×3): 60 mg via ORAL
  Filled 2018-02-26 (×3): qty 1

## 2018-02-26 MED ORDER — SODIUM CHLORIDE 0.9% FLUSH
3.0000 mL | Freq: Two times a day (BID) | INTRAVENOUS | Status: DC
  Administered 2018-02-26 – 2018-02-28 (×3): 3 mL via INTRAVENOUS

## 2018-02-26 MED ORDER — IOHEXOL 300 MG/ML  SOLN
100.0000 mL | Freq: Once | INTRAMUSCULAR | Status: AC | PRN
  Administered 2018-02-26: 100 mL via INTRAVENOUS

## 2018-02-26 MED ORDER — FENTANYL 50 MCG/HR TD PT72
50.0000 ug | MEDICATED_PATCH | TRANSDERMAL | Status: DC
  Administered 2018-02-27: 50 ug via TRANSDERMAL
  Filled 2018-02-26: qty 1

## 2018-02-26 MED ORDER — GABAPENTIN 800 MG PO TABS
800.0000 mg | ORAL_TABLET | Freq: Four times a day (QID) | ORAL | Status: DC
  Filled 2018-02-26: qty 1

## 2018-02-26 MED ORDER — OXYCODONE-ACETAMINOPHEN 5-325 MG PO TABS
1.0000 | ORAL_TABLET | Freq: Four times a day (QID) | ORAL | Status: DC | PRN
  Administered 2018-02-26 – 2018-02-28 (×3): 1 via ORAL
  Filled 2018-02-26 (×3): qty 1

## 2018-02-26 MED ORDER — ARIPIPRAZOLE 5 MG PO TABS
5.0000 mg | ORAL_TABLET | Freq: Two times a day (BID) | ORAL | Status: DC
  Administered 2018-02-26 – 2018-02-28 (×4): 5 mg via ORAL
  Filled 2018-02-26 (×4): qty 1

## 2018-02-26 MED ORDER — ENOXAPARIN SODIUM 40 MG/0.4ML ~~LOC~~ SOLN
40.0000 mg | SUBCUTANEOUS | Status: DC
  Administered 2018-02-26 – 2018-02-27 (×2): 40 mg via SUBCUTANEOUS
  Filled 2018-02-26 (×2): qty 0.4

## 2018-02-26 MED ORDER — ASPIRIN EC 81 MG PO TBEC
81.0000 mg | DELAYED_RELEASE_TABLET | Freq: Every day | ORAL | Status: DC
  Administered 2018-02-26 – 2018-02-28 (×3): 81 mg via ORAL
  Filled 2018-02-26 (×3): qty 1

## 2018-02-26 MED ORDER — NORTRIPTYLINE HCL 25 MG PO CAPS
150.0000 mg | ORAL_CAPSULE | Freq: Every day | ORAL | Status: DC
  Administered 2018-02-26 – 2018-02-27 (×2): 150 mg via ORAL
  Filled 2018-02-26 (×2): qty 6

## 2018-02-26 MED ORDER — HYDROCORTISONE NA SUCCINATE PF 250 MG IJ SOLR
200.0000 mg | Freq: Once | INTRAMUSCULAR | Status: AC
  Administered 2018-02-26: 200 mg via INTRAVENOUS
  Filled 2018-02-26: qty 200

## 2018-02-26 MED ORDER — BUPROPION HCL ER (XL) 150 MG PO TB24
150.0000 mg | ORAL_TABLET | Freq: Every day | ORAL | Status: DC
  Administered 2018-02-26 – 2018-02-28 (×3): 150 mg via ORAL
  Filled 2018-02-26 (×3): qty 1

## 2018-02-26 NOTE — ED Notes (Signed)
Patient transported to CT 

## 2018-02-26 NOTE — ED Notes (Signed)
Admitting at bedside 

## 2018-02-26 NOTE — ED Triage Notes (Addendum)
Pt reports having watery diarrhea x 4 days with nausea, no vomiting. Has abd cramping and generalized fatigue. Reports fever pta. Reports recent antibiotic treatment approx 3 weeks ago.

## 2018-02-26 NOTE — ED Notes (Signed)
Contacted Huseim in CT scan to advise contrast allergy medications were given at 1400

## 2018-02-26 NOTE — ED Notes (Signed)
Pt aware urine sample is needed 

## 2018-02-26 NOTE — ED Notes (Signed)
Called CT to check on transport

## 2018-02-26 NOTE — ED Notes (Signed)
Pt aware stool sample ordered

## 2018-02-26 NOTE — H&P (Signed)
History and Physical   CASSIE SHEDLOCK ZOX:[id number] DOB: Nov 19, 1965 DOA: 02/26/2018  PCP: Jackie Plum, MD  Chief Complaint: abdominal pain and diarrhea  HPI: This is a 52 year old woman with medical problems including history of gastric bypass surgery, history of TIA, prior AVNRT ablation procedure, axonal neuropathy, chronic back pain with a spinal cord stimulator, chronic pain syndrome on opioids, anxiety depression, attention deficit disorder who presents with 4 days of abdominal pain and diarrhea.  She reports onset of symptoms 4 days ago, associated symptoms include fever to 101 Fahrenheit, watery nonbloody diarrhea associated anytime she eats p.o.  She also reports nausea and intermittent shortness of breath.  She reports decreased p.o. intake due to the abdominal pain and nausea.  Denies vomiting.  She reports she took an antimicrobial ciprofloxacin a few weeks ago for an unknown health condition from her PCP.  She denies any history of C. difficile.  Described pain as cramping, located in the lower abdomen more in the left side.  She reports her symptoms are worsening.  She reports over the past year having unformed and malodorous bowel movements, she cites that she is scheduled to be evaluated by gastroenterology in the outpatient setting for consideration of colonoscopy to further evaluate.  She reports chronic peripheral neuropathy from her knees distally as well as her bilateral upper extremities.  She confirms her multi agent pain and anxiety/mental health disorder medications.  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  She does not smoke, seldom drinks alcohol.  She enjoys quilting and gardening.  ED Course: In the emergency room vital signs remarkable for systolic blood pressure 104, heart rate nadir of 45 but most recently is normalized.  CBC and CMP were unremarkable without  overt abnormality.  CT scan of the abdomen pelvis revealed descending colon thickening with pericolonic edema and inflammation concerning for infectious/inflammatory colitis.  There is also reported circumferential bladder wall thickening in the anterior right bladder wall.  Due to allergies, the patient received IV Benadryl and steroids prior to the CT scan with contrast.  Hospital medicine consulted for further management.  Review of Systems: A complete ROS was obtained; pertinent positives negatives are denoted in the HPI. Otherwise, all systems are negative.   Past Medical History:  Diagnosis Date  . ADD (attention deficit disorder)   . Depression   . History of gastric bypass   . Neuropathy   . Spinal cord stimulator status   . TIA (transient ischemic attack)    Social History   Socioeconomic History  . Marital status: Married    Spouse name: Not on file  . Number of children: Not on file  . Years of education: Not on file  . Highest education level: Not on file  Occupational History  . Not on file  Social Needs  . Financial resource strain: Not on file  . Food insecurity:    Worry: Not on file    Inability: Not on file  . Transportation needs:    Medical: Not on file    Non-medical: Not on file  Tobacco Use  . Smoking status: Never Smoker  . Smokeless tobacco: Never Used  Substance and Sexual Activity  . Alcohol use: Yes    Alcohol/week: 0.0 standard drinks    Comment: OCC  . Drug use: No  . Sexual activity: Yes  Lifestyle  . Physical activity:    Days  per week: Not on file    Minutes per session: Not on file  . Stress: Not on file  Relationships  . Social connections:    Talks on phone: Not on file    Gets together: Not on file    Attends religious service: Not on file    Active member of club or organization: Not on file    Attends meetings of clubs or organizations: Not on file    Relationship status: Not on file  . Intimate partner violence:    Fear of  current or ex partner: Not on file    Emotionally abused: Not on file    Physically abused: Not on file    Forced sexual activity: Not on file  Other Topics Concern  . Not on file  Social History Narrative  . Not on file   Family History  Problem Relation Age of Onset  . Cancer Mother        MELANOMA  . Cancer Father        THYROID  . Hypertension Father   . Diabetes Maternal Aunt   . Heart disease Maternal Aunt   . Diabetes Paternal Uncle   . Cancer Maternal Grandmother        LUNG- SMOKER  . Cancer Maternal Grandfather        BONE   . Cancer Paternal Grandmother        SKIN- BASAL CELL    Physical Exam: Vitals:   02/26/18 1842 02/26/18 1900 02/26/18 1930 02/26/18 2030  BP:  113/67 113/62 119/67  Pulse: 79 79 87 82  Resp:    16  Temp:      TempSrc:      SpO2: 99% 100% 100% 97%  Weight:      Height:       General: Appears calm and comfortable Schloesser woman, no acute distress ENT: Grossly normal hearing, MMM. Cardiovascular: RRR. No M/R/G. No LE edema.  Respiratory: CTA bilaterally. No wheezes or crackles. Normal respiratory effort. Abdomen: Soft, tender to palpation LLQ, no rebound or guarding  Skin: No rash or induration seen on limited exam. Musculoskeletal: Grossly normal tone BUE/BLE. Appropriate ROM.  Psychiatric: Flat affect Neurologic: Moves all extremities in coordinated fashion.  Oriented to year.  I have personally reviewed the following labs, culture data, and imaging studies.  Assessment/Plan:  #Colitis, bacterial infection possible Course: presented with 4 days of nausea, abdominal pain, and diarrhea in setting of ciprofloxacin given in the outpatient setting for unknown reason, with report of fever to 101F prior to admission. On admission, hemodynamically stable, no leukocytosis.   CT scan with possible evidence of colitis. A/P: At this juncture differential includes bacterial mediated colitis vs C diff colitis. A C diff test will be most helpful to  differentiate. Otherwise, clinically compatible with diverticulitis-like illness. Therefore, will initiate IVF support (LR 75 cc x 10 hrs then re-assess), anti-emetics, full liquid diet and advance as tolerated in AM, cipro + metronidazole.  C diff test result with help confirm C diff not at play.  #Other problems: -Bladder mass, possible on CT scan: recommend outpatient urology referral to further evaluate -Chronic pain syndrome: continue home fentanyl patch, opioid-acetaminophen combo; interesting - she does not have any standing bowel regimen at home to prevent opioid induced constipation -Anxiety / depression: continue multi-agent outpatient regimen, patient reports stable -Chronic axonal peripheral neuropathy: uncertain cause, continue high dose gabapentin according to OP regimen -Chronic back pain: has spinal cord stimulator -Hypothyroidism:update TSH -ADD: see  no need for stimulant medication at this time / overnight   DVT prophylaxis: Subq Lovenox Code Status: Full code Disposition Plan: Anticipate D/C home as early as 02/27/2018 Consults called: none Admission status: admit to hospital medicine service   Laurell Roof, MD [facility] Page:[phone]  If 7PM-7AM, please contact night-coverage [email or web address] Password [email or web address]  This document was created using the aid of voice recognition / dication software.

## 2018-02-26 NOTE — ED Provider Notes (Signed)
[facility] Emergency Department Provider Note MRN:  [id number]  Arrival date & time: 02/26/18     Chief Complaint   Diarrhea and Abdominal Pain   History of Present Illness   [name] is a 52 y.o. year-old female with a history of gastric bypass presenting to the ED with chief complaint of diarrhea.  4 days of persistent watery diarrhea, nonbloody.  Associated with crampy lower abdominal pain intermittently.  Pain is mild in severity, no exacerbating or alleviating factors.  Mild nausea but no vomiting.  No chest pain or shortness of breath.  Reports that she completed an antibiotic course to 3 weeks ago, unsure of the antibiotic, unsure of the indication for the antibiotic.  Has tried Imodium without any relief of the diarrhea.  Subjective fevers at home.  Review of Systems  A complete 10 system review of systems was obtained and all systems are negative except as noted in the HPI and PMH.   Patient's Health History    Past Medical History:  Diagnosis Date  . ADD (attention deficit disorder)   . Depression   . History of gastric bypass   . Neuropathy   . Spinal cord stimulator status   . TIA (transient ischemic attack)     Past Surgical History:  Procedure Laterality Date  . ABDOMINAL HYSTERECTOMY    . ANKLE SURGERY Left   . BACK SURGERY    . CHOLECYSTECTOMY    . EYE SURGERY    . GASTRIC BYPASS    . TONSILLECTOMY      Family History  Problem Relation Age of Onset  . Cancer Mother        MELANOMA  . Cancer Father        THYROID  . Hypertension Father   . Diabetes Maternal Aunt   . Heart disease Maternal Aunt   . Diabetes Paternal Uncle   . Cancer Maternal Grandmother        LUNG- SMOKER  . Cancer Maternal Grandfather        BONE   . Cancer Paternal Grandmother        SKIN- BASAL CELL    Social History   Socioeconomic History  . Marital status: Married    Spouse name: Not on file  . Number of children: Not on file  . Years of  education: Not on file  . Highest education level: Not on file  Occupational History  . Not on file  Social Needs  . Financial resource strain: Not on file  . Food insecurity:    Worry: Not on file    Inability: Not on file  . Transportation needs:    Medical: Not on file    Non-medical: Not on file  Tobacco Use  . Smoking status: Never Smoker  . Smokeless tobacco: Never Used  Substance and Sexual Activity  . Alcohol use: Yes    Alcohol/week: 0.0 standard drinks    Comment: OCC  . Drug use: No  . Sexual activity: Yes  Lifestyle  . Physical activity:    Days per week: Not on file    Minutes per session: Not on file  . Stress: Not on file  Relationships  . Social connections:    Talks on phone: Not on file    Gets together: Not on file    Attends religious service: Not on file    Active member of club or organization: Not on file    Attends meetings of clubs or  organizations: Not on file    Relationship status: Not on file  . Intimate partner violence:    Fear of current or ex partner: Not on file    Emotionally abused: Not on file    Physically abused: Not on file    Forced sexual activity: Not on file  Other Topics Concern  . Not on file  Social History Narrative  . Not on file     Physical Exam  Vital Signs and Nursing Notes reviewed Vitals:   02/26/18 2100 02/26/18 2144  BP: 102/70 107/64  Pulse: 79 79  Resp: 16 16  Temp:  98.4 F (36.9 C)  SpO2: 98% 100%    CONSTITUTIONAL: Well-appearing, NAD NEURO:  Alert and oriented x 3, no focal deficits EYES:  eyes equal and reactive ENT/NECK:  no LAD, no JVD CARDIO: Regular rate, well-perfused, normal S1 and S2 PULM:  CTAB no wheezing or rhonchi GI/GU:  normal bowel sounds, non-distended, non-tender MSK/SPINE:  No gross deformities, no edema SKIN:  no rash, atraumatic PSYCH:  Appropriate speech and behavior  Diagnostic and Interventional Summary    EKG Interpretation  Date/Time:    Ventricular Rate:      PR Interval:    QRS Duration:   QT Interval:    QTC Calculation:   R Axis:     Text Interpretation:        Labs Reviewed  COMPREHENSIVE METABOLIC PANEL - Abnormal; Notable for the following components:      Result Value   Chloride 97 (*)    Glucose, Bld 103 (*)    All other components within normal limits  URINALYSIS, ROUTINE W REFLEX MICROSCOPIC - Abnormal; Notable for the following components:   Hgb urine dipstick MODERATE (*)    Ketones, ur 5 (*)    Bacteria, UA RARE (*)    All other components within normal limits  C DIFFICILE QUICK SCREEN W PCR REFLEX  C DIFFICILE QUICK SCREEN W PCR REFLEX  LIPASE, BLOOD  CBC  HIV ANTIBODY (ROUTINE TESTING W REFLEX)  BASIC METABOLIC PANEL  CBC  TSH    CT ABDOMEN PELVIS W CONTRAST  Final Result      Medications  aspirin EC tablet 81 mg (81 mg Oral Given 02/26/18 2231)  fentaNYL (DURAGESIC - dosed mcg/hr) 50 mcg (has no administration in time range)  losartan (COZAAR) tablet 100 mg (100 mg Oral Given 02/26/18 2230)  ARIPiprazole (ABILIFY) tablet 5 mg (5 mg Oral Given 02/26/18 2229)  buPROPion (WELLBUTRIN XL) 24 hr tablet 150 mg (150 mg Oral Given 02/26/18 2229)  DULoxetine (CYMBALTA) DR capsule 60 mg (60 mg Oral Given 02/26/18 2232)  nortriptyline (PAMELOR) capsule 150 mg (150 mg Oral Given 02/26/18 2232)  levothyroxine (SYNTHROID, LEVOTHROID) tablet 25 mcg (has no administration in time range)  enoxaparin (LOVENOX) injection 40 mg (40 mg Subcutaneous Given 02/26/18 2228)  sodium chloride flush (NS) 0.9 % injection 3 mL (has no administration in time range)  lactated ringers infusion (has no administration in time range)  ciprofloxacin (CIPRO) tablet 500 mg (500 mg Oral Given 02/26/18 2229)  metroNIDAZOLE (FLAGYL) tablet 500 mg (500 mg Oral Given 02/26/18 2232)  oxyCODONE-acetaminophen (PERCOCET/ROXICET) 5-325 MG per tablet 1 tablet (1 tablet Oral Given 02/26/18 2259)    And  oxyCODONE (Oxy IR/ROXICODONE) immediate release tablet 5 mg  (5 mg Oral Given 02/26/18 2259)  gabapentin (NEURONTIN) capsule 800 mg (800 mg Oral Given 02/26/18 2245)  sodium chloride 0.9 % bolus 1,000 mL (0 mLs Intravenous Stopped 02/26/18  1403)  ondansetron (ZOFRAN) injection 4 mg (4 mg Intravenous Given 02/26/18 1251)  hydrocortisone sodium succinate (SOLU-CORTEF) injection 200 mg (200 mg Intravenous Given 02/26/18 1401)  diphenhydrAMINE (BENADRYL) injection 50 mg (50 mg Intravenous Given 02/26/18 1359)  diphenhydrAMINE (BENADRYL) injection 50 mg (50 mg Intravenous Given 02/26/18 1702)  iohexol (OMNIPAQUE) 300 MG/ML solution 100 mL (100 mLs Intravenous Contrast Given 02/26/18 1818)     Procedures Critical Care  ED Course and Medical Decision Making  I have reviewed the triage vital signs and the nursing notes.  Pertinent labs & imaging results that were available during my care of the patient were reviewed by me and considered in my medical decision making (see below for details).  Concern for possible C. difficile colitis in this [age] year old female with persistent watery diarrhea after antibiotic course.  Abdomen is soft and largely nontender, vital signs stable, afebrile here.  Work-up pending, will consider admission.  Clinical Course as of Feb 26 2301  Wynelle LinkSun Feb 26, 2018  1306 Continued abdominal tenderness, will CT to evaluate for C. difficile colitis and/or complications related to prior gastric bypass   [MB]    Clinical Course User Index [MB] Sabas SousBero, Michael M, MD    CT revealing possible colitis, admitted to hospitalist service for further care and evaluation given the concern for C. difficile.  Elmer SowMichael M. Pilar PlateBero, MD [facility] [email or web address]  Final Clinical Impressions(s) / ED Diagnoses     ICD-10-CM   1. Diarrhea of infectious origin A09   2. Colitis K52.9     ED Discharge Orders    None         Sabas SousBero, Michael M, MD 02/26/18 2302

## 2018-02-27 ENCOUNTER — Encounter (HOSPITAL_COMMUNITY): Payer: Self-pay

## 2018-02-27 DIAGNOSIS — K529 Noninfective gastroenteritis and colitis, unspecified: Principal | ICD-10-CM

## 2018-02-27 DIAGNOSIS — E039 Hypothyroidism, unspecified: Secondary | ICD-10-CM | POA: Diagnosis not present

## 2018-02-27 DIAGNOSIS — N3289 Other specified disorders of bladder: Secondary | ICD-10-CM | POA: Diagnosis not present

## 2018-02-27 DIAGNOSIS — G894 Chronic pain syndrome: Secondary | ICD-10-CM

## 2018-02-27 LAB — BASIC METABOLIC PANEL
Anion gap: 13 (ref 5–15)
BUN: 11 mg/dL (ref 6–20)
CALCIUM: 8.7 mg/dL — AB (ref 8.9–10.3)
CHLORIDE: 101 mmol/L (ref 98–111)
CO2: 23 mmol/L (ref 22–32)
Creatinine, Ser: 0.95 mg/dL (ref 0.44–1.00)
GFR calc Af Amer: >60 mL/min (ref >60)
GFR calc non Af Amer: >60 mL/min (ref >60)
Glucose, Bld: 86 mg/dL (ref 70–99)
Potassium: 3.4 mmol/L — ABNORMAL LOW (ref 3.5–5.1)
Sodium: 137 mmol/L (ref 135–145)

## 2018-02-27 LAB — CBC
HEMATOCRIT: 31.7 % — AB (ref 36.0–46.0)
Hemoglobin: 10.3 g/dL — ABNORMAL LOW (ref 12.0–15.0)
MCH: 28.7 pg (ref 26.0–34.0)
MCHC: 32.5 g/dL (ref 30.0–36.0)
MCV: 88.3 fL (ref 78.0–100.0)
Platelets: 289 10*3/uL (ref 150–400)
RBC: 3.59 MIL/uL — ABNORMAL LOW (ref 3.87–5.11)
RDW: 12.1 % (ref 11.5–15.5)
WBC: 7 10*3/uL (ref 4.0–10.5)

## 2018-02-27 LAB — TSH: TSH: 0.856 u[IU]/mL (ref 0.350–4.500)

## 2018-02-27 LAB — HIV ANTIBODY (ROUTINE TESTING W REFLEX): HIV Screen 4th Generation wRfx: NONREACTIVE

## 2018-02-27 LAB — CORTISOL: Cortisol, Plasma: 5.2 ug/dL

## 2018-02-27 MED ORDER — POTASSIUM CHLORIDE CRYS ER 20 MEQ PO TBCR
40.0000 meq | EXTENDED_RELEASE_TABLET | Freq: Once | ORAL | Status: AC
  Administered 2018-02-27: 40 meq via ORAL
  Filled 2018-02-27: qty 2

## 2018-02-27 MED ORDER — LACTATED RINGERS IV SOLN
INTRAVENOUS | Status: DC
  Administered 2018-02-27: 12:00:00 via INTRAVENOUS

## 2018-02-27 MED ORDER — AMPHETAMINE-DEXTROAMPHETAMINE 10 MG PO TABS
20.0000 mg | ORAL_TABLET | Freq: Three times a day (TID) | ORAL | Status: DC
  Administered 2018-02-28 (×2): 20 mg via ORAL
  Filled 2018-02-27 (×4): qty 2

## 2018-02-27 MED ORDER — SODIUM CHLORIDE 0.9 % IV BOLUS
500.0000 mL | Freq: Once | INTRAVENOUS | Status: AC
  Administered 2018-02-27: 500 mL via INTRAVENOUS

## 2018-02-27 MED ORDER — SODIUM CHLORIDE 0.9 % IV SOLN
INTRAVENOUS | Status: DC
  Administered 2018-02-27 – 2018-02-28 (×3): via INTRAVENOUS

## 2018-02-27 MED ORDER — COSYNTROPIN 0.25 MG IJ SOLR
0.2500 mg | Freq: Once | INTRAMUSCULAR | Status: AC
  Administered 2018-02-28: 0.25 mg via INTRAVENOUS
  Filled 2018-02-27: qty 0.25

## 2018-02-27 NOTE — Progress Notes (Signed)
[facility] PROGRESS NOTE  Kari GobbleGail M Antuna ZOX:[id number] DOB: 1965-07-01 DOA: 02/26/2018  PCP: Jackie Plumsei-Bonsu, George, MD  Brief History/Interval Summary: 52 year old woman with medical problems including history of gastric bypass surgery, history of TIA, prior AVNRT ablation procedure, axonal neuropathy, chronic back pain with a spinal cord stimulator, chronic pain syndrome on opioids, anxiety depression, attention deficit disorder who presented with 4 days of abdominal pain and diarrhea.  Patient reported taking antibiotics for possible UTI a few weeks ago.  CT scan suggested colitis.  Patient was hospitalized for further management.  Reason for Visit: Acute colitis  Consultants: None  Procedures: None  Antibiotics: Ciprofloxacin and metronidazole  Subjective/Interval History: Patient still feels fatigued.  However she has not had any further loose stools.  Still remains on liquid diet.  Denies any blood in the stool previously.  ROS: No shortness of breath  Objective:  Vital Signs  Vitals:   02/26/18 2030 02/26/18 2100 02/26/18 2144 02/27/18 0457  BP: 119/67 102/70 107/64 (!) 91/48  Pulse: 82 79 79 80  Resp: 16 16 16 12   Temp:   98.4 F (36.9 C) 98.2 F (36.8 C)  TempSrc:   Oral Oral  SpO2: 97% 98% 100% 97%  Weight:   76.7 kg   Height:   5\' 6"  (1.676 m)     Intake/Output Summary (Last 24 hours) at 02/27/2018 1058 Last data filed at 02/27/2018 0845 Gross per 24 hour  Intake 1799.5 ml  Output 2 ml  Net 1797.5 ml   Filed Weights   02/26/18 1252 02/26/18 2144  Weight: 79.4 kg 76.7 kg    General appearance: alert, cooperative, appears stated age and no distress Resp: clear to auscultation bilaterally Cardio: regular rate and rhythm, S1, S2 normal, no murmur, click, rub or gallop GI: Abdomen soft.  Mild discomfort in the lower abdomen. nondistended.  Bowel sounds are present normal. Extremities: extremities normal, atraumatic, no cyanosis or edema Pulses: 2+  and symmetric Neurologic: Alert and oriented x3.  No focal neurological deficits.  Lab Results:  Data Reviewed: I have personally reviewed following labs and imaging studies  CBC: Recent Labs  Lab 02/26/18 1200 02/27/18 0509  WBC 7.7 7.0  HGB 13.0 10.3*  HCT 41.1 31.7*  MCV 88.8 88.3  PLT 379 289    Basic Metabolic Panel: Recent Labs  Lab 02/26/18 1200 02/27/18 0509  NA 136 137  K 3.6 3.4*  CL 97* 101  CO2 25 23  GLUCOSE 103* 86  BUN 9 11  CREATININE 0.92 0.95  CALCIUM 9.7 8.7*    GFR: Estimated Creatinine Clearance: 74.2 mL/min (by C-G formula based on SCr of 0.95 mg/dL).  Liver Function Tests: Recent Labs  Lab 02/26/18 1200  AST 20  ALT 11  ALKPHOS 121  BILITOT 0.6  PROT 7.3  ALBUMIN 3.9    Recent Labs  Lab 02/26/18 1200  LIPASE 20   Thyroid Function Tests: Recent Labs    02/27/18 0444  TSH 0.856      Radiology Studies: Ct Abdomen Pelvis W Contrast  Result Date: 02/26/2018 CLINICAL DATA:  Abdominal cramping and generalized fatigue. Fever. Watery diarrhea. EXAM: CT ABDOMEN AND PELVIS WITH CONTRAST TECHNIQUE: Multidetector CT imaging of the abdomen and pelvis was performed using the standard protocol following bolus administration of intravenous contrast. CONTRAST:  100mL OMNIPAQUE IOHEXOL 300 MG/ML  SOLN COMPARISON:  None. FINDINGS: Lower chest: Unremarkable. Hepatobiliary: No focal abnormality within the liver parenchyma. Gallbladder surgically absent. Mild prominence of the intrahepatic bile ducts with  extrahepatic common duct measuring 13 mm diameter intrahepatic biliary distention is similar to CT chest of 03/06/2013 but the common duct was not included on that study. Pancreas: No focal mass lesion. Fatty deposition evident within the dorsal anlage of the pancreatic head. No dilatation of the main duct. No intraparenchymal cyst. No peripancreatic edema. Spleen: No splenomegaly. No focal mass lesion. Adrenals/Urinary Tract: No adrenal nodule or  mass. Kidneys unremarkable. No evidence for hydroureter. Circumferential bladder wall thickening noted with asymmetric more focal anterior right bladder wall thickening evident (71/3 and 49/6). Stomach/Bowel: Status post gastric bypass. Duodenum is normally positioned as is the ligament of Treitz. No small bowel wall thickening. No small bowel dilatation. The terminal ileum is normal. The appendix is normal. Right and transverse colon unremarkable. Descending colon is decompressed which may account for the wall thickening, but there is apparent subtle pericolonic edema/inflammation. Sigmoid colon and rectum unremarkable. Vascular/Lymphatic: There is abdominal aortic atherosclerosis without aneurysm. There is no gastrohepatic or hepatoduodenal ligament lymphadenopathy. No intraperitoneal or retroperitoneal lymphadenopathy. No pelvic sidewall lymphadenopathy. Reproductive: Uterus surgically absent.  There is no adnexal mass. Other: No intraperitoneal free fluid. Musculoskeletal: No worrisome lytic or sclerotic osseous abnormality. Battery pack for spinal stimulator device noted right flank. IMPRESSION: 1. Descending colon is decompressed which may account for circumferential wall thickening but there appears to be subtle pericolonic edema/inflammation. Infectious/inflammatory colitis cannot be excluded. 2. Intra and extrahepatic biliary duct distention. The degree of intrahepatic distention is similar to chest CT of 03/06/2013. Changes may be related to prior cholecystectomy. Correlation with liver function test may prove helpful. 3. Circumferential bladder wall thickening with an apparent more focal degree of thickening in the anterior right bladder wall. Bladder infection could cause this appearance and since urothelial neoplasm cannot be excluded, follow-up urology consultation may be warranted. 4.  Aortic Atherosclerois (ICD10-170.0) Electronically Signed   By: Kennith Center M.D.   On: 02/26/2018 18:58      Medications:  Scheduled: . ARIPiprazole  5 mg Oral BID  . aspirin EC  81 mg Oral Daily  . buPROPion  150 mg Oral Daily  . ciprofloxacin  500 mg Oral BID  . DULoxetine  60 mg Oral Daily  . enoxaparin (LOVENOX) injection  40 mg Subcutaneous Q24H  . fentaNYL  50 mcg Transdermal Q48H  . gabapentin  800 mg Oral QID  . levothyroxine  25 mcg Oral QAC breakfast  . losartan  100 mg Oral Daily  . metroNIDAZOLE  500 mg Oral Q8H  . nortriptyline  150 mg Oral QHS  . sodium chloride flush  3 mL Intravenous Q12H   Continuous:  [id number] **AND** oxyCODONE  Assessment/Plan:    Acute colitis, possibly infectious Symptoms appear to be improving.  No stool sample has been sent to the lab yet.  Patient agreeable to advance her diet.  We will advance her to solids today and see how she does.  Mobilize.  Continue with antibiotics for now.  She will need to follow-up with her primary care provider.  She will need to undergo colonoscopy in the next few weeks if she has not had one recently.  Hypokalemia/dehydration Continue IV fluids.  Prevent potassium.  Advance diet.  Urinary bladder wall thickening Noted incidentally on CT scan.  UA does show mild hematuria although no significant RBCs present.  Discussed this with the patient.  She will need to be referred to urology in the outpatient setting.  She can discuss this with her primary care provider.  History of  chronic pain syndrome/chronic back pain Continue with home medications.  History of anxiety and depression Stable.  Continue with home medications  Chronic axonal peripheral neuropathy Stable.  Continue with home medications.  Hypothyroidism Stable.  Continue home medications  DVT Prophylaxis: Lovenox    Code Status: Full code Family Communication: Discussed with the patient Disposition Plan: Mobilize.  Advance diet.  Continue IV fluids for another day.    LOS: 0 days   Osvaldo Shipper  [facility] Pager [phone] 02/27/2018, 10:58 AM  If 7PM-7AM, please contact night-coverage at [email or web address], password [facility]

## 2018-02-27 NOTE — Progress Notes (Signed)
Received pt alert and oriented x4. Ambulated to room. Oriented pt with room and use of call light. Isolation precaution maintained. Will monitor pt.

## 2018-02-28 DIAGNOSIS — N3289 Other specified disorders of bladder: Secondary | ICD-10-CM | POA: Diagnosis not present

## 2018-02-28 DIAGNOSIS — G894 Chronic pain syndrome: Secondary | ICD-10-CM | POA: Diagnosis not present

## 2018-02-28 DIAGNOSIS — E039 Hypothyroidism, unspecified: Secondary | ICD-10-CM | POA: Diagnosis not present

## 2018-02-28 DIAGNOSIS — K529 Noninfective gastroenteritis and colitis, unspecified: Secondary | ICD-10-CM | POA: Diagnosis not present

## 2018-02-28 LAB — CBC
HCT: 31.1 % — ABNORMAL LOW (ref 36.0–46.0)
Hemoglobin: 9.4 g/dL — ABNORMAL LOW (ref 12.0–15.0)
MCH: 27.6 pg (ref 26.0–34.0)
MCHC: 30.2 g/dL (ref 30.0–36.0)
MCV: 91.2 fL (ref 78.0–100.0)
PLATELETS: 264 10*3/uL (ref 150–400)
RBC: 3.41 MIL/uL — AB (ref 3.87–5.11)
RDW: 12.5 % (ref 11.5–15.5)
WBC: 4.2 10*3/uL (ref 4.0–10.5)

## 2018-02-28 LAB — ACTH STIMULATION, 3 TIME POINTS
CORTISOL 30 MIN: 19.4 ug/dL
CORTISOL 60 MIN: 22.1 ug/dL
Cortisol, Base: 10.3 ug/dL

## 2018-02-28 LAB — C DIFFICILE QUICK SCREEN W PCR REFLEX
C Diff antigen: NEGATIVE
C Diff interpretation: NOT DETECTED
C Diff toxin: NEGATIVE

## 2018-02-28 LAB — BASIC METABOLIC PANEL
ANION GAP: 6 (ref 5–15)
BUN: 10 mg/dL (ref 6–20)
CO2: 25 mmol/L (ref 22–32)
Calcium: 8.8 mg/dL — ABNORMAL LOW (ref 8.9–10.3)
Chloride: 112 mmol/L — ABNORMAL HIGH (ref 98–111)
Creatinine, Ser: 0.79 mg/dL (ref 0.44–1.00)
Glucose, Bld: 87 mg/dL (ref 70–99)
POTASSIUM: 3.7 mmol/L (ref 3.5–5.1)
SODIUM: 143 mmol/L (ref 135–145)

## 2018-02-28 MED ORDER — METRONIDAZOLE 500 MG PO TABS: 500.0000 mg | ORAL_TABLET | Freq: Three times a day (TID) | ORAL | 0 refills | Status: AC

## 2018-02-28 MED ORDER — CIPROFLOXACIN HCL 500 MG PO TABS: 500.0000 mg | ORAL_TABLET | Freq: Two times a day (BID) | ORAL | 0 refills | Status: AC

## 2018-02-28 NOTE — Discharge Instructions (Signed)
Colitis Colitis is inflammation of the colon. Colitis may last a short time (acute) or it may last a long time (chronic). What are the causes? This condition may be caused by:  Viruses.  Bacteria.  Reactions to medicine.  Certain autoimmune diseases, such as Crohn disease or ulcerative colitis.  What are the signs or symptoms? Symptoms of this condition include:  Diarrhea.  Passing bloody or tarry stool.  Pain.  Fever.  Vomiting.  Tiredness (fatigue).  Weight loss.  Bloating.  Sudden increase in abdominal pain.  Having fewer bowel movements than usual.  How is this diagnosed? This condition is diagnosed with a stool test or a blood test. You may also have other tests, including X-rays, a CT scan, or a colonoscopy. How is this treated? Treatment may include:  Resting the bowel. This involves not eating or drinking for a period of time.  Fluids that are given through an IV tube.  Medicine for pain and diarrhea.  Antibiotic medicines.  Cortisone medicines.  Surgery.  Follow these instructions at home: Eating and drinking  Follow instructions from your health care provider about eating or drinking restrictions.  Drink enough fluid to keep your urine clear or pale yellow.  Work with a dietitian to determine which foods cause your condition to flare up.  Avoid foods that cause flare-ups.  Eat a well-balanced diet. Medicines  Take over-the-counter and prescription medicines only as told by your health care provider.  If you were prescribed an antibiotic medicine, take it as told by your health care provider. Do not stop taking the antibiotic even if you start to feel better. General instructions  Keep all follow-up visits as told by your health care provider. This is important. Contact a health care provider if:  Your symptoms do not go away.  You develop new symptoms. Get help right away if:  You have a fever that does not go away with  treatment.  You develop chills.  You have extreme weakness, fainting, or dehydration.  You have repeated vomiting.  You develop severe pain in your abdomen.  You pass bloody or tarry stool. This information is not intended to replace advice given to you by your health care provider. Make sure you discuss any questions you have with your health care provider. Document Released: 07/01/2004 Document Revised: 10/30/2015 Document Reviewed: 09/16/2014 [employer] Interactive Patient Education  2018 [employer]  

## 2018-02-28 NOTE — Discharge Summary (Signed)
[facility]  Physician Discharge Summary   Patient ID: [name] MRN: [id number] DOB/AGE: Oct 16, 1965 52 y.o.  Admit date: 02/26/2018 Discharge date: 02/28/2018  PCP: Jackie Plum, MD  DISCHARGE DIAGNOSES:  Acute colitis  RECOMMENDATIONS FOR OUTPATIENT FOLLOW UP: 1. Primary care provider to consider referral to gastroenterology for colonoscopy if not done recently 2. Needs referral to urology for urinary bladder wall thickening noted on CT scan   DISCHARGE CONDITION: fair  Diet recommendation: Low-sodium as tolerated  Filed Weights   02/26/18 1252 02/26/18 2144  Weight: 79.4 kg 76.7 kg    INITIAL HISTORY: 52 year old woman with medical problems including history of gastric bypass surgery, history of TIA, prior AVNRT ablation procedure, axonal neuropathy, chronic back pain with a spinal cord stimulator, chronic pain syndrome on opioids, anxiety depression, attention deficit disorder who presented with 4 days of abdominal pain and diarrhea.  Patient reported taking antibiotics for possible UTI a few weeks ago.  CT scan suggested colitis.  Patient was hospitalized for further management.   HOSPITAL COURSE:   Acute colitis, possibly infectious Patient was admitted to the hospital.  She did not have any diarrhea after she was admitted.  So initially no stool sample was sent.  Her abdomen was benign on examination.  Her pain subsided.  She did not have any nausea vomiting.  Her diet was advanced which she is tolerating well.  She was placed on oral antibiotics.  She did have one episode of loose stool this morning which was sent to the lab and is negative for C. difficile.  She feels better otherwise.  Okay for discharge today with oral antibiotic course, ciprofloxacin and metronidazole.  She will need to see her primary care provider in follow-up.  Will need to be referred to gastroenterology to consider colonoscopy in the near future if not done recently.     Hypokalemia/dehydration Elect lites were repleted.  She was given IV fluids.  Hypotension Patient's blood pressure was noted to be low yesterday.  Most likely due to hypovolemia.  She was also continued on her Cozaar which could have aggravated the condition.  Cosyntropin stimulation test was unremarkable.  Blood pressures improved this morning.  Would recommend that she continue to hold her antihypertensives till she has followed up with her PCP.  Urinary bladder wall thickening Noted incidentally on CT scan.  UA does show mild hematuria although no significant RBCs present.  Discussed this with the patient.  She will need to be referred to urology in the outpatient setting.  She can discuss this with her primary care provider.  History of chronic pain syndrome/chronic back pain Continue with home medications.  History of anxiety and depression Stable.  Continue with home medications  Chronic axonal peripheral neuropathy Stable.  Continue with home medications.  Hypothyroidism Stable.  Continue home medications  Overall stable.  Okay for discharge home today.   PERTINENT LABS:  The results of significant diagnostics from this hospitalization (including imaging, microbiology, ancillary and laboratory) are listed below for reference.    Microbiology: Recent Results (from the past 240 hour(s))  C difficile quick scan w PCR reflex     Status: None   Collection Time: 02/28/18  8:48 AM  Result Value Ref Range Status   C Diff antigen NEGATIVE NEGATIVE Final   C Diff toxin NEGATIVE NEGATIVE Final   C Diff interpretation No C. difficile detected.  Final    Comment: Performed at [facility], [address], [place], [place]  [id number]     Labs: Basic Metabolic Panel: Recent Labs  Lab 02/26/18 1200 02/27/18 0509 02/28/18 0854  NA 136 137 143  K 3.6 3.4* 3.7  CL 97* 101 112*  CO2 25 23 25   GLUCOSE 103* 86 87  BUN 9 11 10   CREATININE 0.92 0.95 0.79  CALCIUM  9.7 8.7* 8.8*   Liver Function Tests: Recent Labs  Lab 02/26/18 1200  AST 20  ALT 11  ALKPHOS 121  BILITOT 0.6  PROT 7.3  ALBUMIN 3.9   Recent Labs  Lab 02/26/18 1200  LIPASE 20   CBC: Recent Labs  Lab 02/26/18 1200 02/27/18 0509 02/28/18 0854  WBC 7.7 7.0 4.2  HGB 13.0 10.3* 9.4*  HCT 41.1 31.7* 31.1*  MCV 88.8 88.3 91.2  PLT 379 289 264     IMAGING STUDIES Ct Abdomen Pelvis W Contrast  Result Date: 02/26/2018 CLINICAL DATA:  Abdominal cramping and generalized fatigue. Fever. Watery diarrhea. EXAM: CT ABDOMEN AND PELVIS WITH CONTRAST TECHNIQUE: Multidetector CT imaging of the abdomen and pelvis was performed using the standard protocol following bolus administration of intravenous contrast. CONTRAST:  100mL OMNIPAQUE IOHEXOL 300 MG/ML  SOLN COMPARISON:  None. FINDINGS: Lower chest: Unremarkable. Hepatobiliary: No focal abnormality within the liver parenchyma. Gallbladder surgically absent. Mild prominence of the intrahepatic bile ducts with extrahepatic common duct measuring 13 mm diameter intrahepatic biliary distention is similar to CT chest of 03/06/2013 but the common duct was not included on that study. Pancreas: No focal mass lesion. Fatty deposition evident within the dorsal anlage of the pancreatic head. No dilatation of the main duct. No intraparenchymal cyst. No peripancreatic edema. Spleen: No splenomegaly. No focal mass lesion. Adrenals/Urinary Tract: No adrenal nodule or mass. Kidneys unremarkable. No evidence for hydroureter. Circumferential bladder wall thickening noted with asymmetric more focal anterior right bladder wall thickening evident (71/3 and 49/6). Stomach/Bowel: Status post gastric bypass. Duodenum is normally positioned as is the ligament of Treitz. No small bowel wall thickening. No small bowel dilatation. The terminal ileum is normal. The appendix is normal. Right and transverse colon unremarkable. Descending colon is decompressed which may account  for the wall thickening, but there is apparent subtle pericolonic edema/inflammation. Sigmoid colon and rectum unremarkable. Vascular/Lymphatic: There is abdominal aortic atherosclerosis without aneurysm. There is no gastrohepatic or hepatoduodenal ligament lymphadenopathy. No intraperitoneal or retroperitoneal lymphadenopathy. No pelvic sidewall lymphadenopathy. Reproductive: Uterus surgically absent.  There is no adnexal mass. Other: No intraperitoneal free fluid. Musculoskeletal: No worrisome lytic or sclerotic osseous abnormality. Battery pack for spinal stimulator device noted right flank. IMPRESSION: 1. Descending colon is decompressed which may account for circumferential wall thickening but there appears to be subtle pericolonic edema/inflammation. Infectious/inflammatory colitis cannot be excluded. 2. Intra and extrahepatic biliary duct distention. The degree of intrahepatic distention is similar to chest CT of 03/06/2013. Changes may be related to prior cholecystectomy. Correlation with liver function test may prove helpful. 3. Circumferential bladder wall thickening with an apparent more focal degree of thickening in the anterior right bladder wall. Bladder infection could cause this appearance and since urothelial neoplasm cannot be excluded, follow-up urology consultation may be warranted. 4.  Aortic Atherosclerois (ICD10-170.0) Electronically Signed   By: Kennith CenterEric  Mansell M.D.   On: 02/26/2018 18:58    DISCHARGE EXAMINATION: Vitals:   02/27/18 1556 02/27/18 1728 02/27/18 2306 02/28/18 0529  BP: (!) 84/48 100/65 (!) 100/49 112/73  Pulse:  75 73 70  Resp:   16 18  Temp:   97.9 F (36.6 C)  97.8 F (36.6 C)  TempSrc:   Oral Oral  SpO2:   97% 100%  Weight:      Height:       General appearance: alert, cooperative, appears stated age and no distress Resp: clear to auscultation bilaterally Cardio: regular rate and rhythm, S1, S2 normal, no murmur, click, rub or gallop GI: soft, non-tender;  bowel sounds normal; no masses,  no organomegaly  DISPOSITION: Home  Discharge Instructions    Call MD for:  difficulty breathing, headache or visual disturbances   Complete by:  As directed    Call MD for:  extreme fatigue   Complete by:  As directed    Call MD for:  persistant dizziness or light-headedness   Complete by:  As directed    Call MD for:  persistant nausea and vomiting   Complete by:  As directed    Call MD for:  severe uncontrolled pain   Complete by:  As directed    Call MD for:  temperature >100.4   Complete by:  As directed    Diet general   Complete by:  As directed    Discharge instructions   Complete by:  As directed    Please do not take your blood pressure medications till you have followed up with your primary care provider.  Please take your antibiotics as prescribed.  Your primary care provider will need to refer you to a urologist for the abnormal appearing urinary bladder seen on CT scan.  If you have not had a colonoscopy recently you will need to have one in the next 1 to 2 months.  Your primary care provider can refer you to a gastroenterologist for the same.  You were cared for by a hospitalist during your hospital stay. If you have any questions about your discharge medications or the care you received while you were in the hospital after you are discharged, you can call the unit and asked to speak with the hospitalist on call if the hospitalist that took care of you is not available. Once you are discharged, your primary care physician will handle any further medical issues. Please note that NO REFILLS for any discharge medications will be authorized once you are discharged, as it is imperative that you return to your primary care physician (or establish a relationship with a primary care physician if you do not have one) for your aftercare needs so that they can reassess your need for medications and monitor your lab values. If you do not have a primary care  physician, you can call [phone] for a physician referral.   Increase activity slowly   Complete by:  As directed         Allergies as of 02/28/2018      Reactions   Lactose Intolerance (gi) Shortness Of Breath, Other (See Comments)   "swiss cheese"    Other Shortness Of Breath   Egg plant   Apple Fruit Extract Nausea And Vomiting   Iodine Hives   Shellfish Allergy Hives      Medication List    STOP taking these medications   losartan 100 MG tablet Commonly known as:  COZAAR     TAKE these medications   amphetamine-dextroamphetamine 20 MG tablet Commonly known as:  ADDERALL Take 20 mg by mouth 3 (three) times daily.   ARIPiprazole 5 MG tablet Commonly known as:  ABILIFY Take 5 mg by mouth 2 (two) times daily.   aspirin EC 81 MG tablet Take  81 mg by mouth daily.   buPROPion 150 MG 24 hr tablet Commonly known as:  WELLBUTRIN XL Take 150 mg by mouth daily.   ciprofloxacin 500 MG tablet Commonly known as:  CIPRO Take 1 tablet (500 mg total) by mouth 2 (two) times daily for 8 days.   DULoxetine 60 MG capsule Commonly known as:  CYMBALTA Take 60 mg by mouth daily.   fentaNYL 50 MCG/HR Commonly known as:  DURAGESIC - dosed mcg/hr Place 1 patch onto the skin every other day.   gabapentin 800 MG tablet Commonly known as:  NEURONTIN Take 800 mg by mouth 4 (four) times daily.   ibuprofen 200 MG tablet Commonly known as:  ADVIL,MOTRIN Take 400-600 mg by mouth every 6 (six) hours as needed for pain.   levothyroxine 25 MCG tablet Commonly known as:  SYNTHROID, LEVOTHROID Take 25 mcg by mouth daily before breakfast.   lidocaine 5 % Commonly known as:  LIDODERM Place 1 patch onto the skin daily as needed (pain). Remove & Discard patch within 12 hours or as directed by MD   metroNIDAZOLE 500 MG tablet Commonly known as:  FLAGYL Take 1 tablet (500 mg total) by mouth every 8 (eight) hours for 8 days.   nortriptyline 75 MG capsule Commonly known as:   PAMELOR Take 150 mg by mouth at bedtime.   oxyCODONE-acetaminophen 10-325 MG tablet Commonly known as:  PERCOCET Take 1 tablet by mouth 2 (two) times daily.        Follow-up Information    Osei-Bonsu, Greggory Stallion, MD. Schedule an appointment as soon as possible for a visit in 1 week(s).   Specialty:  Internal Medicine Why:  to discuss referral to GI and urology Contact information: [address] [place] [address] [phone]           TOTAL DISCHARGE TIME: 35 mins  Osvaldo Shipper  [facility] Pager [phone]  02/28/2018, 12:44 PM

## 2018-02-28 NOTE — Progress Notes (Signed)
Pt discharged home in stable condition after going over discharge teaching with no concerns voiced. AVS and discharge scripts given before leaving 

## 2018-03-01 DIAGNOSIS — Z1231 Encounter for screening mammogram for malignant neoplasm of breast: Secondary | ICD-10-CM | POA: Diagnosis not present

## 2018-03-06 DIAGNOSIS — S8001XA Contusion of right knee, initial encounter: Secondary | ICD-10-CM | POA: Diagnosis not present

## 2018-03-06 DIAGNOSIS — S8002XA Contusion of left knee, initial encounter: Secondary | ICD-10-CM | POA: Diagnosis not present

## 2018-03-06 DIAGNOSIS — R197 Diarrhea, unspecified: Secondary | ICD-10-CM | POA: Diagnosis not present

## 2018-03-06 DIAGNOSIS — R194 Change in bowel habit: Secondary | ICD-10-CM | POA: Diagnosis not present

## 2018-03-06 DIAGNOSIS — D509 Iron deficiency anemia, unspecified: Secondary | ICD-10-CM | POA: Diagnosis not present

## 2018-03-13 DIAGNOSIS — S8001XD Contusion of right knee, subsequent encounter: Secondary | ICD-10-CM | POA: Diagnosis not present

## 2018-03-13 DIAGNOSIS — S8002XD Contusion of left knee, subsequent encounter: Secondary | ICD-10-CM | POA: Diagnosis not present

## 2018-03-20 ENCOUNTER — Ambulatory Visit
Admission: RE | Admit: 2018-03-20 | Discharge: 2018-03-20 | Disposition: A | Discharge status: Home / Self Care | Payer: Medicare HMO | Source: Ambulatory Visit | Attending: Pain Medicine | Admitting: Pain Medicine

## 2018-03-20 ENCOUNTER — Other Ambulatory Visit: Payer: Self-pay | Admitting: Pain Medicine

## 2018-03-20 DIAGNOSIS — M25552 Pain in left hip: Principal | ICD-10-CM

## 2018-03-20 DIAGNOSIS — M25551 Pain in right hip: Secondary | ICD-10-CM

## 2018-03-22 DIAGNOSIS — M5136 Other intervertebral disc degeneration, lumbar region: Secondary | ICD-10-CM | POA: Diagnosis not present

## 2018-03-22 DIAGNOSIS — Z79891 Long term (current) use of opiate analgesic: Secondary | ICD-10-CM | POA: Diagnosis not present

## 2018-03-22 DIAGNOSIS — Z79899 Other long term (current) drug therapy: Secondary | ICD-10-CM | POA: Diagnosis not present

## 2018-03-22 DIAGNOSIS — M47817 Spondylosis without myelopathy or radiculopathy, lumbosacral region: Secondary | ICD-10-CM | POA: Diagnosis not present

## 2018-03-22 DIAGNOSIS — G894 Chronic pain syndrome: Secondary | ICD-10-CM | POA: Diagnosis not present

## 2018-03-23 DIAGNOSIS — K2970 Gastritis, unspecified, without bleeding: Secondary | ICD-10-CM | POA: Diagnosis not present

## 2018-03-23 DIAGNOSIS — R197 Diarrhea, unspecified: Secondary | ICD-10-CM | POA: Diagnosis not present

## 2018-03-23 DIAGNOSIS — R634 Abnormal weight loss: Secondary | ICD-10-CM | POA: Diagnosis not present

## 2018-03-23 DIAGNOSIS — K319 Disease of stomach and duodenum, unspecified: Secondary | ICD-10-CM | POA: Diagnosis not present

## 2018-03-23 DIAGNOSIS — K529 Noninfective gastroenteritis and colitis, unspecified: Secondary | ICD-10-CM | POA: Diagnosis not present

## 2018-04-19 DIAGNOSIS — G894 Chronic pain syndrome: Secondary | ICD-10-CM | POA: Diagnosis not present

## 2018-04-19 DIAGNOSIS — R194 Change in bowel habit: Secondary | ICD-10-CM | POA: Diagnosis not present

## 2018-04-19 DIAGNOSIS — M5136 Other intervertebral disc degeneration, lumbar region: Secondary | ICD-10-CM | POA: Diagnosis not present

## 2018-04-19 DIAGNOSIS — M47817 Spondylosis without myelopathy or radiculopathy, lumbosacral region: Secondary | ICD-10-CM | POA: Diagnosis not present

## 2018-04-19 DIAGNOSIS — R197 Diarrhea, unspecified: Secondary | ICD-10-CM | POA: Diagnosis not present

## 2018-04-19 DIAGNOSIS — G629 Polyneuropathy, unspecified: Secondary | ICD-10-CM | POA: Diagnosis not present

## 2018-04-24 DIAGNOSIS — R69 Illness, unspecified: Secondary | ICD-10-CM | POA: Diagnosis not present

## 2018-04-24 DIAGNOSIS — F411 Generalized anxiety disorder: Secondary | ICD-10-CM | POA: Diagnosis not present

## 2018-04-24 DIAGNOSIS — F909 Attention-deficit hyperactivity disorder, unspecified type: Secondary | ICD-10-CM | POA: Diagnosis not present

## 2018-04-26 DIAGNOSIS — E039 Hypothyroidism, unspecified: Secondary | ICD-10-CM | POA: Diagnosis not present

## 2018-04-26 DIAGNOSIS — R69 Illness, unspecified: Secondary | ICD-10-CM | POA: Diagnosis not present

## 2018-04-26 DIAGNOSIS — G894 Chronic pain syndrome: Secondary | ICD-10-CM | POA: Diagnosis not present

## 2018-04-26 DIAGNOSIS — D509 Iron deficiency anemia, unspecified: Secondary | ICD-10-CM | POA: Diagnosis not present

## 2018-04-26 DIAGNOSIS — I10 Essential (primary) hypertension: Secondary | ICD-10-CM | POA: Diagnosis not present

## 2018-04-26 DIAGNOSIS — G629 Polyneuropathy, unspecified: Secondary | ICD-10-CM | POA: Diagnosis not present

## 2018-04-26 DIAGNOSIS — R1013 Epigastric pain: Secondary | ICD-10-CM | POA: Diagnosis not present

## 2018-04-26 DIAGNOSIS — I119 Hypertensive heart disease without heart failure: Secondary | ICD-10-CM | POA: Diagnosis not present

## 2018-04-26 DIAGNOSIS — R600 Localized edema: Secondary | ICD-10-CM | POA: Diagnosis not present

## 2018-04-28 DIAGNOSIS — M79642 Pain in left hand: Secondary | ICD-10-CM | POA: Diagnosis not present

## 2018-05-11 DIAGNOSIS — M7989 Other specified soft tissue disorders: Secondary | ICD-10-CM | POA: Diagnosis not present

## 2018-05-15 DIAGNOSIS — G629 Polyneuropathy, unspecified: Secondary | ICD-10-CM | POA: Diagnosis not present

## 2018-05-15 DIAGNOSIS — R1013 Epigastric pain: Secondary | ICD-10-CM | POA: Diagnosis not present

## 2018-05-15 DIAGNOSIS — R69 Illness, unspecified: Secondary | ICD-10-CM | POA: Diagnosis not present

## 2018-05-15 DIAGNOSIS — I119 Hypertensive heart disease without heart failure: Secondary | ICD-10-CM | POA: Diagnosis not present

## 2018-05-15 DIAGNOSIS — G894 Chronic pain syndrome: Secondary | ICD-10-CM | POA: Diagnosis not present

## 2018-05-15 DIAGNOSIS — D509 Iron deficiency anemia, unspecified: Secondary | ICD-10-CM | POA: Diagnosis not present

## 2018-05-15 DIAGNOSIS — E039 Hypothyroidism, unspecified: Secondary | ICD-10-CM | POA: Diagnosis not present

## 2018-05-15 DIAGNOSIS — I10 Essential (primary) hypertension: Secondary | ICD-10-CM | POA: Diagnosis not present

## 2018-05-15 DIAGNOSIS — M25512 Pain in left shoulder: Secondary | ICD-10-CM | POA: Diagnosis not present

## 2018-05-17 DIAGNOSIS — M40292 Other kyphosis, cervical region: Secondary | ICD-10-CM | POA: Diagnosis not present

## 2018-05-17 DIAGNOSIS — R293 Abnormal posture: Secondary | ICD-10-CM | POA: Diagnosis not present

## 2018-05-17 DIAGNOSIS — G543 Thoracic root disorders, not elsewhere classified: Secondary | ICD-10-CM | POA: Diagnosis not present

## 2018-05-17 DIAGNOSIS — D509 Iron deficiency anemia, unspecified: Secondary | ICD-10-CM | POA: Diagnosis not present

## 2018-05-17 DIAGNOSIS — M5137 Other intervertebral disc degeneration, lumbosacral region: Secondary | ICD-10-CM | POA: Diagnosis not present

## 2018-05-17 DIAGNOSIS — G894 Chronic pain syndrome: Secondary | ICD-10-CM | POA: Diagnosis not present

## 2018-05-17 DIAGNOSIS — M47817 Spondylosis without myelopathy or radiculopathy, lumbosacral region: Secondary | ICD-10-CM | POA: Diagnosis not present

## 2018-05-17 DIAGNOSIS — R197 Diarrhea, unspecified: Secondary | ICD-10-CM | POA: Diagnosis not present

## 2018-05-17 DIAGNOSIS — G540 Brachial plexus disorders: Secondary | ICD-10-CM | POA: Diagnosis not present

## 2018-05-17 DIAGNOSIS — M533 Sacrococcygeal disorders, not elsewhere classified: Secondary | ICD-10-CM | POA: Diagnosis not present

## 2018-05-18 ENCOUNTER — Other Ambulatory Visit: Payer: Self-pay | Admitting: Pain Medicine

## 2018-05-18 DIAGNOSIS — M545 Low back pain, unspecified: Secondary | ICD-10-CM

## 2018-05-19 DIAGNOSIS — G540 Brachial plexus disorders: Secondary | ICD-10-CM | POA: Diagnosis not present

## 2018-05-19 DIAGNOSIS — M40292 Other kyphosis, cervical region: Secondary | ICD-10-CM | POA: Diagnosis not present

## 2018-05-19 DIAGNOSIS — G543 Thoracic root disorders, not elsewhere classified: Secondary | ICD-10-CM | POA: Diagnosis not present

## 2018-05-19 DIAGNOSIS — R293 Abnormal posture: Secondary | ICD-10-CM | POA: Diagnosis not present

## 2018-05-22 DIAGNOSIS — G540 Brachial plexus disorders: Secondary | ICD-10-CM | POA: Diagnosis not present

## 2018-05-22 DIAGNOSIS — M40292 Other kyphosis, cervical region: Secondary | ICD-10-CM | POA: Diagnosis not present

## 2018-05-22 DIAGNOSIS — G543 Thoracic root disorders, not elsewhere classified: Secondary | ICD-10-CM | POA: Diagnosis not present

## 2018-05-22 DIAGNOSIS — R293 Abnormal posture: Secondary | ICD-10-CM | POA: Diagnosis not present

## 2018-05-27 ENCOUNTER — Ambulatory Visit
Admission: RE | Admit: 2018-05-27 | Discharge: 2018-05-27 | Disposition: A | Discharge status: Home / Self Care | Payer: Medicare HMO | Source: Ambulatory Visit | Attending: Pain Medicine | Admitting: Pain Medicine

## 2018-05-27 DIAGNOSIS — M5136 Other intervertebral disc degeneration, lumbar region: Secondary | ICD-10-CM | POA: Diagnosis not present

## 2018-05-27 DIAGNOSIS — M545 Low back pain, unspecified: Secondary | ICD-10-CM

## 2018-06-14 DIAGNOSIS — G5790 Unspecified mononeuropathy of unspecified lower limb: Secondary | ICD-10-CM | POA: Diagnosis not present

## 2018-06-14 DIAGNOSIS — M47817 Spondylosis without myelopathy or radiculopathy, lumbosacral region: Secondary | ICD-10-CM | POA: Diagnosis not present

## 2018-06-14 DIAGNOSIS — G894 Chronic pain syndrome: Secondary | ICD-10-CM | POA: Diagnosis not present

## 2018-06-14 DIAGNOSIS — Z79891 Long term (current) use of opiate analgesic: Secondary | ICD-10-CM | POA: Diagnosis not present

## 2018-06-14 DIAGNOSIS — M5137 Other intervertebral disc degeneration, lumbosacral region: Secondary | ICD-10-CM | POA: Diagnosis not present

## 2018-06-14 DIAGNOSIS — Z79899 Other long term (current) drug therapy: Secondary | ICD-10-CM | POA: Diagnosis not present

## 2018-07-19 DIAGNOSIS — G894 Chronic pain syndrome: Secondary | ICD-10-CM | POA: Diagnosis not present

## 2018-07-19 DIAGNOSIS — M47817 Spondylosis without myelopathy or radiculopathy, lumbosacral region: Secondary | ICD-10-CM | POA: Diagnosis not present

## 2018-07-19 DIAGNOSIS — G5790 Unspecified mononeuropathy of unspecified lower limb: Secondary | ICD-10-CM | POA: Diagnosis not present

## 2018-07-19 DIAGNOSIS — G629 Polyneuropathy, unspecified: Secondary | ICD-10-CM | POA: Diagnosis not present

## 2018-07-20 DIAGNOSIS — F411 Generalized anxiety disorder: Secondary | ICD-10-CM | POA: Diagnosis not present

## 2018-07-20 DIAGNOSIS — R69 Illness, unspecified: Secondary | ICD-10-CM | POA: Diagnosis not present

## 2018-07-20 DIAGNOSIS — F909 Attention-deficit hyperactivity disorder, unspecified type: Secondary | ICD-10-CM | POA: Diagnosis not present

## 2018-07-26 ENCOUNTER — Other Ambulatory Visit: Payer: Self-pay | Admitting: Pain Medicine

## 2018-07-26 DIAGNOSIS — M542 Cervicalgia: Secondary | ICD-10-CM

## 2018-08-04 ENCOUNTER — Ambulatory Visit
Admission: RE | Admit: 2018-08-04 | Discharge: 2018-08-04 | Disposition: A | Discharge status: Home / Self Care | Payer: Medicare HMO | Source: Ambulatory Visit | Attending: Pain Medicine | Admitting: Pain Medicine

## 2018-08-04 DIAGNOSIS — M542 Cervicalgia: Secondary | ICD-10-CM

## 2018-08-04 DIAGNOSIS — M4802 Spinal stenosis, cervical region: Secondary | ICD-10-CM | POA: Diagnosis not present

## 2018-08-21 DIAGNOSIS — G5790 Unspecified mononeuropathy of unspecified lower limb: Secondary | ICD-10-CM | POA: Diagnosis not present

## 2018-08-21 DIAGNOSIS — G894 Chronic pain syndrome: Secondary | ICD-10-CM | POA: Diagnosis not present

## 2018-08-21 DIAGNOSIS — M5030 Other cervical disc degeneration, unspecified cervical region: Secondary | ICD-10-CM | POA: Diagnosis not present

## 2018-08-21 DIAGNOSIS — G5600 Carpal tunnel syndrome, unspecified upper limb: Secondary | ICD-10-CM | POA: Diagnosis not present

## 2018-08-21 DIAGNOSIS — M5412 Radiculopathy, cervical region: Secondary | ICD-10-CM | POA: Diagnosis not present

## 2018-08-21 DIAGNOSIS — M47817 Spondylosis without myelopathy or radiculopathy, lumbosacral region: Secondary | ICD-10-CM | POA: Diagnosis not present

## 2018-08-21 DIAGNOSIS — M47812 Spondylosis without myelopathy or radiculopathy, cervical region: Secondary | ICD-10-CM | POA: Diagnosis not present

## 2018-09-08 DIAGNOSIS — G894 Chronic pain syndrome: Secondary | ICD-10-CM | POA: Diagnosis not present

## 2018-09-08 DIAGNOSIS — I10 Essential (primary) hypertension: Secondary | ICD-10-CM | POA: Diagnosis not present

## 2018-09-08 DIAGNOSIS — R600 Localized edema: Secondary | ICD-10-CM | POA: Diagnosis not present

## 2018-09-08 DIAGNOSIS — D509 Iron deficiency anemia, unspecified: Secondary | ICD-10-CM | POA: Diagnosis not present

## 2018-09-08 DIAGNOSIS — E039 Hypothyroidism, unspecified: Secondary | ICD-10-CM | POA: Diagnosis not present

## 2018-09-08 DIAGNOSIS — R1013 Epigastric pain: Secondary | ICD-10-CM | POA: Diagnosis not present

## 2018-09-08 DIAGNOSIS — I119 Hypertensive heart disease without heart failure: Secondary | ICD-10-CM | POA: Diagnosis not present

## 2018-09-08 DIAGNOSIS — R69 Illness, unspecified: Secondary | ICD-10-CM | POA: Diagnosis not present

## 2018-09-08 DIAGNOSIS — G629 Polyneuropathy, unspecified: Secondary | ICD-10-CM | POA: Diagnosis not present

## 2018-09-11 DIAGNOSIS — R42 Dizziness and giddiness: Secondary | ICD-10-CM | POA: Diagnosis not present

## 2018-09-11 DIAGNOSIS — G543 Thoracic root disorders, not elsewhere classified: Secondary | ICD-10-CM | POA: Diagnosis not present

## 2018-09-11 DIAGNOSIS — M40292 Other kyphosis, cervical region: Secondary | ICD-10-CM | POA: Diagnosis not present

## 2018-09-11 DIAGNOSIS — R293 Abnormal posture: Secondary | ICD-10-CM | POA: Diagnosis not present

## 2018-09-11 DIAGNOSIS — G540 Brachial plexus disorders: Secondary | ICD-10-CM | POA: Diagnosis not present

## 2018-09-14 DIAGNOSIS — G540 Brachial plexus disorders: Secondary | ICD-10-CM | POA: Diagnosis not present

## 2018-09-14 DIAGNOSIS — M40292 Other kyphosis, cervical region: Secondary | ICD-10-CM | POA: Diagnosis not present

## 2018-09-14 DIAGNOSIS — G543 Thoracic root disorders, not elsewhere classified: Secondary | ICD-10-CM | POA: Diagnosis not present

## 2018-09-14 DIAGNOSIS — R42 Dizziness and giddiness: Secondary | ICD-10-CM | POA: Diagnosis not present

## 2018-09-26 DIAGNOSIS — Z79891 Long term (current) use of opiate analgesic: Secondary | ICD-10-CM | POA: Diagnosis not present

## 2018-09-26 DIAGNOSIS — Z79899 Other long term (current) drug therapy: Secondary | ICD-10-CM | POA: Diagnosis not present

## 2018-09-26 DIAGNOSIS — G894 Chronic pain syndrome: Secondary | ICD-10-CM | POA: Diagnosis not present

## 2018-09-26 DIAGNOSIS — G629 Polyneuropathy, unspecified: Secondary | ICD-10-CM | POA: Diagnosis not present

## 2018-09-26 DIAGNOSIS — M533 Sacrococcygeal disorders, not elsewhere classified: Secondary | ICD-10-CM | POA: Diagnosis not present

## 2018-09-26 DIAGNOSIS — M5137 Other intervertebral disc degeneration, lumbosacral region: Secondary | ICD-10-CM | POA: Diagnosis not present

## 2018-09-28 DIAGNOSIS — G543 Thoracic root disorders, not elsewhere classified: Secondary | ICD-10-CM | POA: Diagnosis not present

## 2018-09-28 DIAGNOSIS — G540 Brachial plexus disorders: Secondary | ICD-10-CM | POA: Diagnosis not present

## 2018-09-28 DIAGNOSIS — R42 Dizziness and giddiness: Secondary | ICD-10-CM | POA: Diagnosis not present

## 2018-09-28 DIAGNOSIS — M40292 Other kyphosis, cervical region: Secondary | ICD-10-CM | POA: Diagnosis not present

## 2018-10-04 DIAGNOSIS — G543 Thoracic root disorders, not elsewhere classified: Secondary | ICD-10-CM | POA: Diagnosis not present

## 2018-10-04 DIAGNOSIS — R42 Dizziness and giddiness: Secondary | ICD-10-CM | POA: Diagnosis not present

## 2018-10-04 DIAGNOSIS — M40292 Other kyphosis, cervical region: Secondary | ICD-10-CM | POA: Diagnosis not present

## 2018-10-04 DIAGNOSIS — G540 Brachial plexus disorders: Secondary | ICD-10-CM | POA: Diagnosis not present

## 2018-10-06 DIAGNOSIS — M40292 Other kyphosis, cervical region: Secondary | ICD-10-CM | POA: Diagnosis not present

## 2018-10-06 DIAGNOSIS — G543 Thoracic root disorders, not elsewhere classified: Secondary | ICD-10-CM | POA: Diagnosis not present

## 2018-10-06 DIAGNOSIS — G540 Brachial plexus disorders: Secondary | ICD-10-CM | POA: Diagnosis not present

## 2018-10-06 DIAGNOSIS — R42 Dizziness and giddiness: Secondary | ICD-10-CM | POA: Diagnosis not present

## 2018-10-09 DIAGNOSIS — R1013 Epigastric pain: Secondary | ICD-10-CM | POA: Diagnosis not present

## 2018-10-09 DIAGNOSIS — I10 Essential (primary) hypertension: Secondary | ICD-10-CM | POA: Diagnosis not present

## 2018-10-09 DIAGNOSIS — E039 Hypothyroidism, unspecified: Secondary | ICD-10-CM | POA: Diagnosis not present

## 2018-10-09 DIAGNOSIS — G629 Polyneuropathy, unspecified: Secondary | ICD-10-CM | POA: Diagnosis not present

## 2018-10-09 DIAGNOSIS — I119 Hypertensive heart disease without heart failure: Secondary | ICD-10-CM | POA: Diagnosis not present

## 2018-10-09 DIAGNOSIS — G540 Brachial plexus disorders: Secondary | ICD-10-CM | POA: Diagnosis not present

## 2018-10-09 DIAGNOSIS — R42 Dizziness and giddiness: Secondary | ICD-10-CM | POA: Diagnosis not present

## 2018-10-09 DIAGNOSIS — G894 Chronic pain syndrome: Secondary | ICD-10-CM | POA: Diagnosis not present

## 2018-10-09 DIAGNOSIS — M40292 Other kyphosis, cervical region: Secondary | ICD-10-CM | POA: Diagnosis not present

## 2018-10-09 DIAGNOSIS — D509 Iron deficiency anemia, unspecified: Secondary | ICD-10-CM | POA: Diagnosis not present

## 2018-10-09 DIAGNOSIS — R600 Localized edema: Secondary | ICD-10-CM | POA: Diagnosis not present

## 2018-10-09 DIAGNOSIS — G543 Thoracic root disorders, not elsewhere classified: Secondary | ICD-10-CM | POA: Diagnosis not present

## 2018-10-09 DIAGNOSIS — R69 Illness, unspecified: Secondary | ICD-10-CM | POA: Diagnosis not present

## 2018-10-12 ENCOUNTER — Other Ambulatory Visit: Payer: Self-pay | Admitting: Sports Medicine

## 2018-10-12 DIAGNOSIS — S46012A Strain of muscle(s) and tendon(s) of the rotator cuff of left shoulder, initial encounter: Secondary | ICD-10-CM | POA: Diagnosis not present

## 2018-10-12 DIAGNOSIS — R42 Dizziness and giddiness: Secondary | ICD-10-CM | POA: Diagnosis not present

## 2018-10-12 DIAGNOSIS — G540 Brachial plexus disorders: Secondary | ICD-10-CM | POA: Diagnosis not present

## 2018-10-12 DIAGNOSIS — M40292 Other kyphosis, cervical region: Secondary | ICD-10-CM | POA: Diagnosis not present

## 2018-10-12 DIAGNOSIS — G543 Thoracic root disorders, not elsewhere classified: Secondary | ICD-10-CM | POA: Diagnosis not present

## 2018-10-12 DIAGNOSIS — M25512 Pain in left shoulder: Secondary | ICD-10-CM

## 2018-10-16 ENCOUNTER — Ambulatory Visit
Admission: RE | Admit: 2018-10-16 | Discharge: 2018-10-16 | Disposition: A | Discharge status: Home / Self Care | Payer: Medicare HMO | Source: Ambulatory Visit | Attending: Sports Medicine | Admitting: Sports Medicine

## 2018-10-16 ENCOUNTER — Other Ambulatory Visit: Payer: Self-pay

## 2018-10-16 DIAGNOSIS — M25512 Pain in left shoulder: Secondary | ICD-10-CM | POA: Diagnosis not present

## 2018-10-16 DIAGNOSIS — F411 Generalized anxiety disorder: Secondary | ICD-10-CM | POA: Diagnosis not present

## 2018-10-16 DIAGNOSIS — R69 Illness, unspecified: Secondary | ICD-10-CM | POA: Diagnosis not present

## 2018-10-16 DIAGNOSIS — F909 Attention-deficit hyperactivity disorder, unspecified type: Secondary | ICD-10-CM | POA: Diagnosis not present

## 2018-10-19 DIAGNOSIS — M7582 Other shoulder lesions, left shoulder: Secondary | ICD-10-CM | POA: Diagnosis not present

## 2018-10-24 DIAGNOSIS — G894 Chronic pain syndrome: Secondary | ICD-10-CM | POA: Diagnosis not present

## 2018-10-24 DIAGNOSIS — M47816 Spondylosis without myelopathy or radiculopathy, lumbar region: Secondary | ICD-10-CM | POA: Diagnosis not present

## 2018-10-24 DIAGNOSIS — M47817 Spondylosis without myelopathy or radiculopathy, lumbosacral region: Secondary | ICD-10-CM | POA: Diagnosis not present

## 2018-10-24 DIAGNOSIS — G5600 Carpal tunnel syndrome, unspecified upper limb: Secondary | ICD-10-CM | POA: Diagnosis not present

## 2018-11-10 DIAGNOSIS — R1013 Epigastric pain: Secondary | ICD-10-CM | POA: Diagnosis not present

## 2018-11-10 DIAGNOSIS — I10 Essential (primary) hypertension: Secondary | ICD-10-CM | POA: Diagnosis not present

## 2018-11-10 DIAGNOSIS — I119 Hypertensive heart disease without heart failure: Secondary | ICD-10-CM | POA: Diagnosis not present

## 2018-11-10 DIAGNOSIS — G894 Chronic pain syndrome: Secondary | ICD-10-CM | POA: Diagnosis not present

## 2018-11-10 DIAGNOSIS — E039 Hypothyroidism, unspecified: Secondary | ICD-10-CM | POA: Diagnosis not present

## 2018-11-10 DIAGNOSIS — D509 Iron deficiency anemia, unspecified: Secondary | ICD-10-CM | POA: Diagnosis not present

## 2018-11-10 DIAGNOSIS — R69 Illness, unspecified: Secondary | ICD-10-CM | POA: Diagnosis not present

## 2018-11-10 DIAGNOSIS — G629 Polyneuropathy, unspecified: Secondary | ICD-10-CM | POA: Diagnosis not present

## 2018-11-10 DIAGNOSIS — R600 Localized edema: Secondary | ICD-10-CM | POA: Diagnosis not present

## 2018-11-16 DIAGNOSIS — G629 Polyneuropathy, unspecified: Secondary | ICD-10-CM | POA: Diagnosis not present

## 2018-11-21 ENCOUNTER — Other Ambulatory Visit: Payer: Self-pay | Admitting: Physician Assistant

## 2018-11-21 ENCOUNTER — Other Ambulatory Visit: Payer: Self-pay

## 2018-11-21 ENCOUNTER — Ambulatory Visit
Admission: RE | Admit: 2018-11-21 | Discharge: 2018-11-21 | Disposition: A | Discharge status: Home / Self Care | Payer: Medicare HMO | Source: Ambulatory Visit | Attending: Physician Assistant | Admitting: Physician Assistant

## 2018-11-21 DIAGNOSIS — M25531 Pain in right wrist: Secondary | ICD-10-CM

## 2018-11-21 DIAGNOSIS — Z79891 Long term (current) use of opiate analgesic: Secondary | ICD-10-CM | POA: Diagnosis not present

## 2018-11-21 DIAGNOSIS — S60211A Contusion of right wrist, initial encounter: Secondary | ICD-10-CM | POA: Diagnosis not present

## 2018-11-21 DIAGNOSIS — M47816 Spondylosis without myelopathy or radiculopathy, lumbar region: Secondary | ICD-10-CM | POA: Diagnosis not present

## 2018-11-21 DIAGNOSIS — G894 Chronic pain syndrome: Secondary | ICD-10-CM | POA: Diagnosis not present

## 2018-11-21 DIAGNOSIS — M5137 Other intervertebral disc degeneration, lumbosacral region: Secondary | ICD-10-CM | POA: Diagnosis not present

## 2018-11-21 DIAGNOSIS — Z79899 Other long term (current) drug therapy: Secondary | ICD-10-CM | POA: Diagnosis not present

## 2018-11-21 DIAGNOSIS — M47817 Spondylosis without myelopathy or radiculopathy, lumbosacral region: Secondary | ICD-10-CM | POA: Diagnosis not present

## 2018-11-23 DIAGNOSIS — M25531 Pain in right wrist: Secondary | ICD-10-CM | POA: Diagnosis not present

## 2018-11-29 IMAGING — CR DG HIP (WITH OR WITHOUT PELVIS) 3-4V BILAT
4 series · 4 of 4 positions shown · non-contrast
Comparison: 02/26/2018 CT.

CLINICAL DATA: 52-year-old female with chronic bilateral hip pain
greater on the right. Initial encounter.

EXAM:
DG HIP (WITH OR WITHOUT PELVIS) 3-4V BILAT

[w hip ap left]
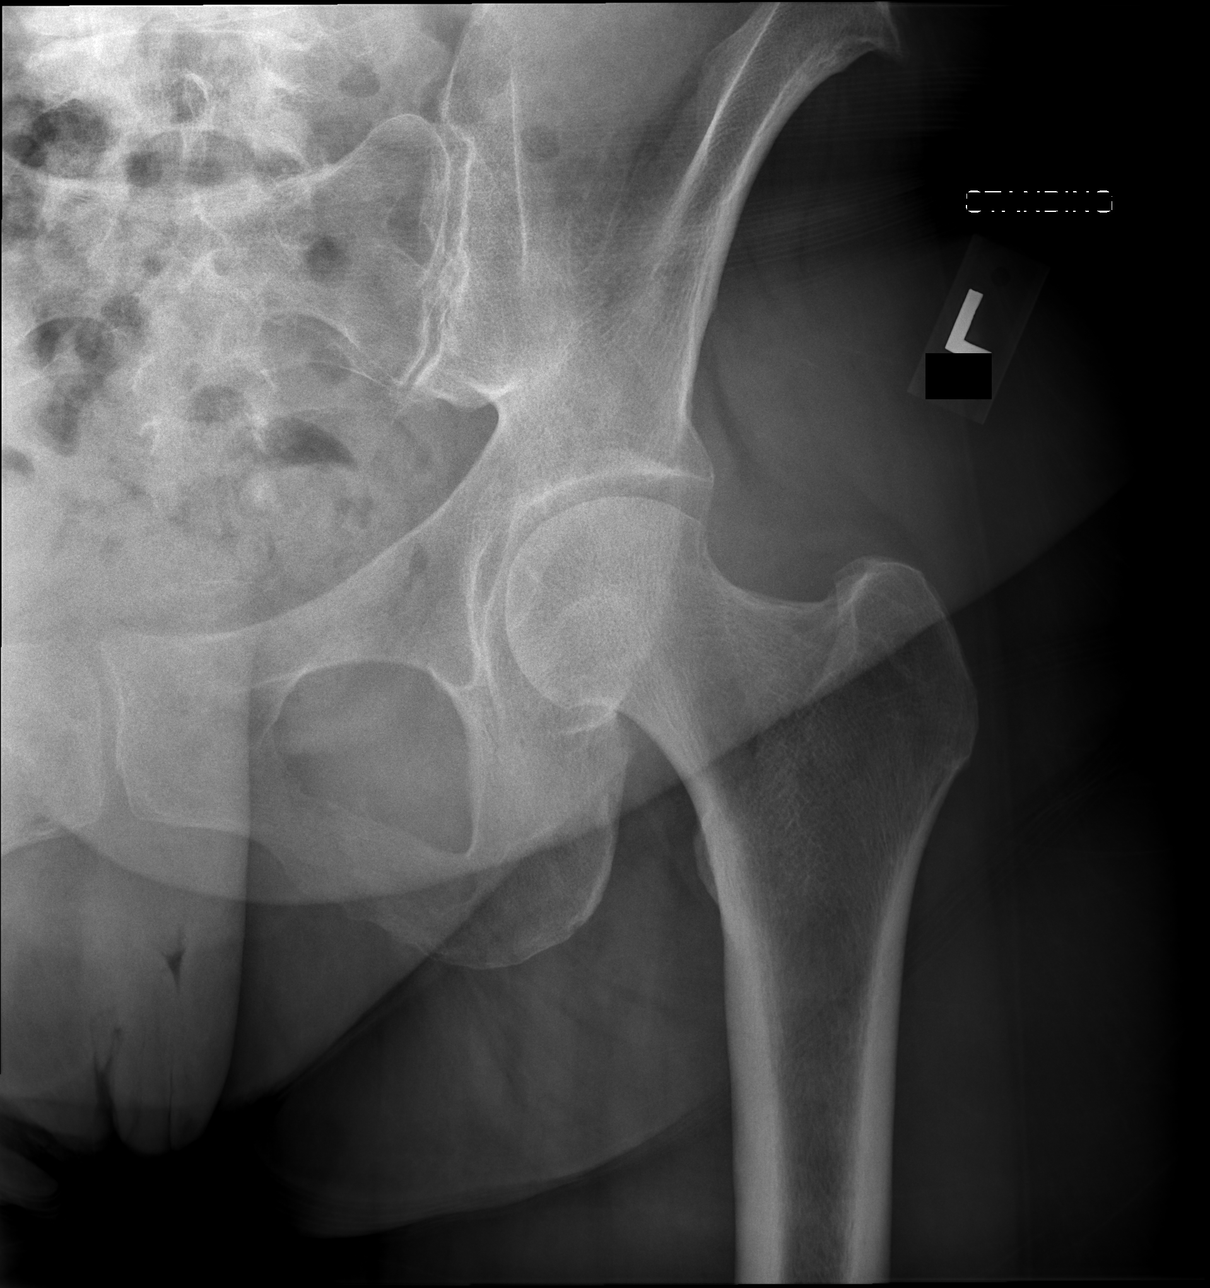

[w hip frog left]
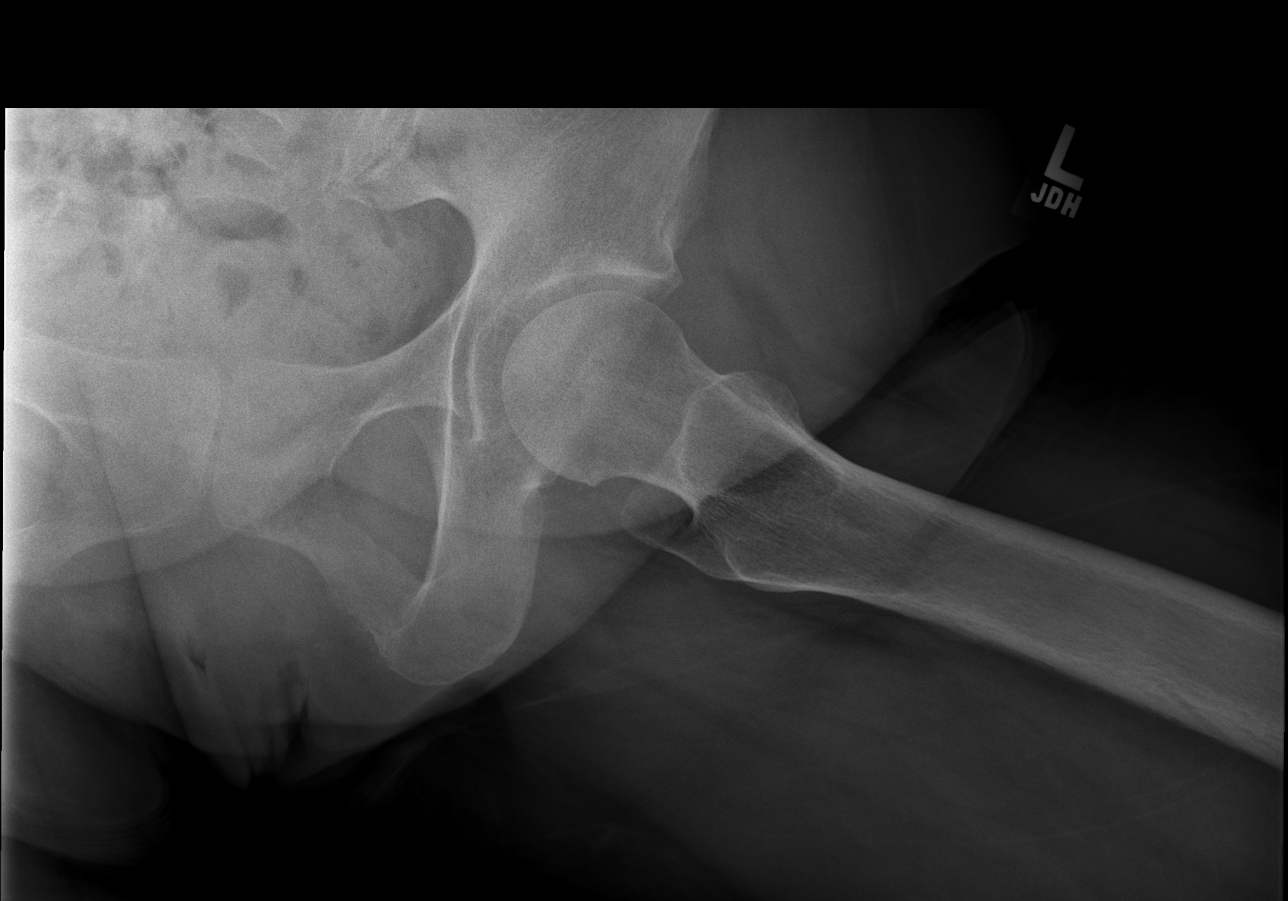

[w hip ap right]
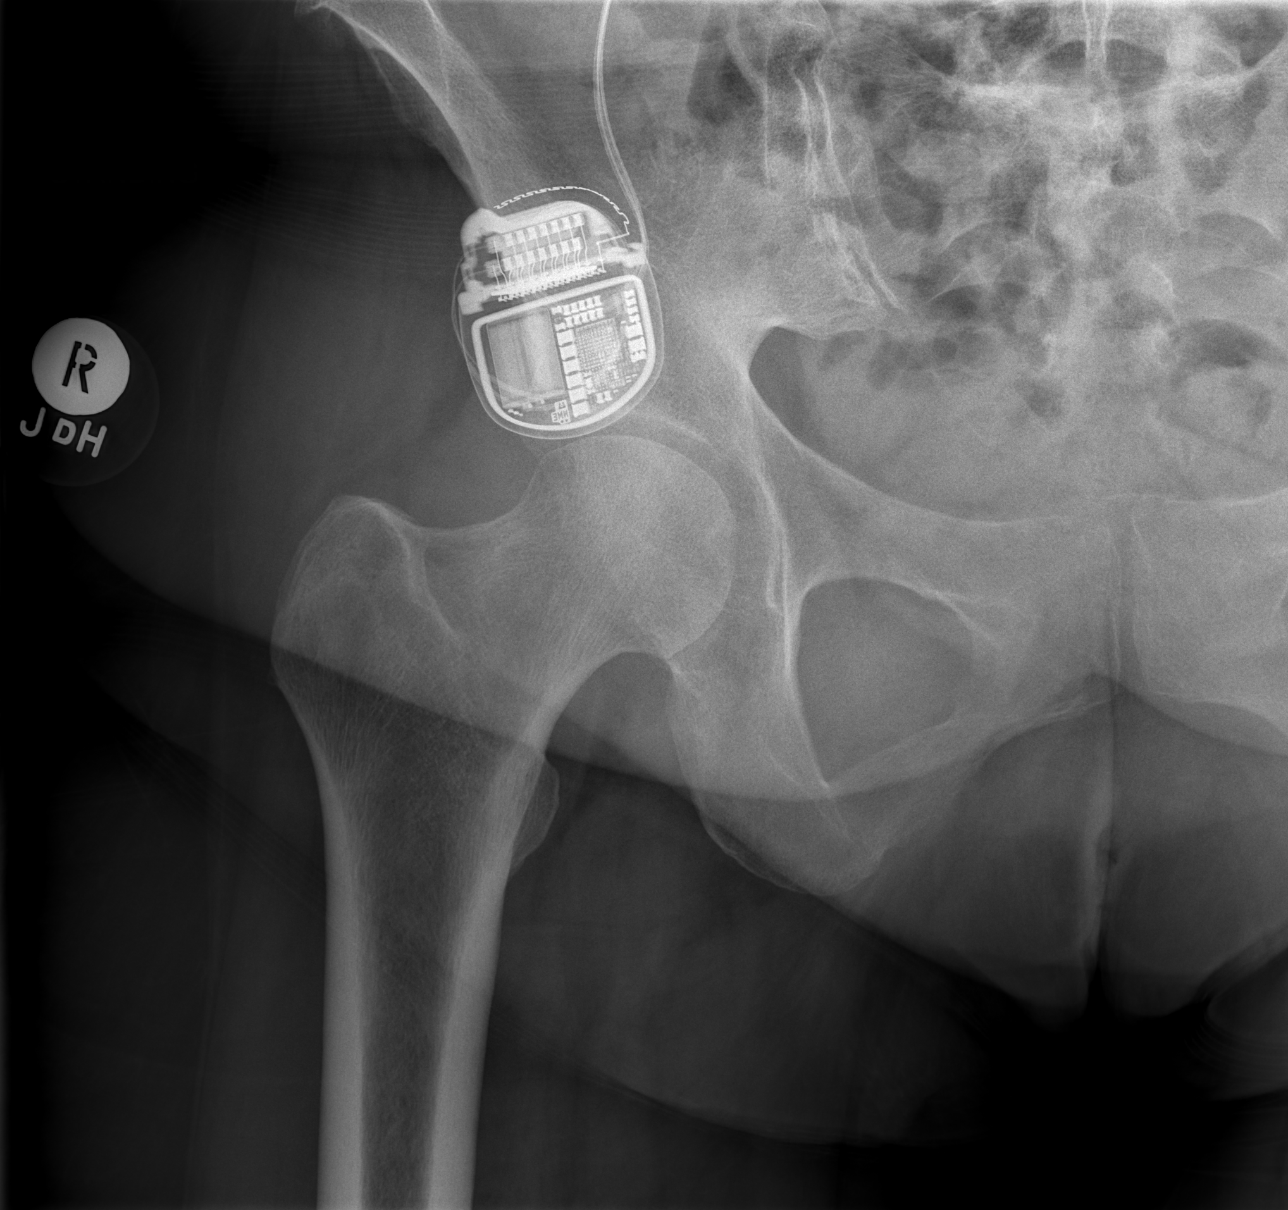

[w hip frog right]
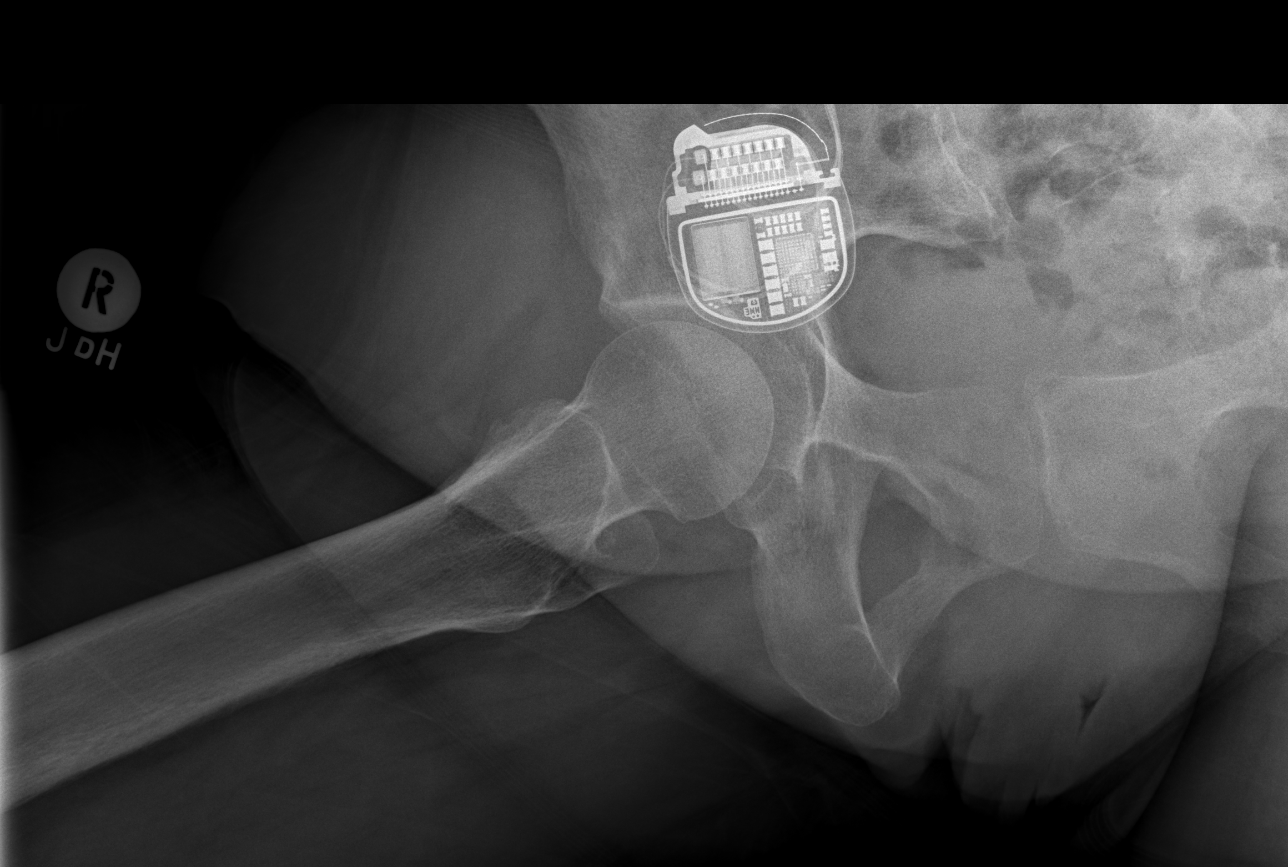

[4 of 4 positions shown; findings below may reference images not displayed]

FINDINGS: There is no evidence of hip fracture or dislocation. There is no
evidence of arthropathy or other focal bone abnormality. No plain
film evidence of femoral head avascular necrosis. Sacroiliac joints
appear intact.

Spine stimulating device partially visualized.
IMPRESSION: Negative.

## 2018-12-20 DIAGNOSIS — G894 Chronic pain syndrome: Secondary | ICD-10-CM | POA: Diagnosis not present

## 2018-12-20 DIAGNOSIS — M5030 Other cervical disc degeneration, unspecified cervical region: Secondary | ICD-10-CM | POA: Diagnosis not present

## 2018-12-20 DIAGNOSIS — L570 Actinic keratosis: Secondary | ICD-10-CM | POA: Diagnosis not present

## 2018-12-20 DIAGNOSIS — M5136 Other intervertebral disc degeneration, lumbar region: Secondary | ICD-10-CM | POA: Diagnosis not present

## 2018-12-20 DIAGNOSIS — M47817 Spondylosis without myelopathy or radiculopathy, lumbosacral region: Secondary | ICD-10-CM | POA: Diagnosis not present

## 2018-12-20 DIAGNOSIS — L304 Erythema intertrigo: Secondary | ICD-10-CM | POA: Diagnosis not present

## 2019-01-18 DIAGNOSIS — M5136 Other intervertebral disc degeneration, lumbar region: Secondary | ICD-10-CM | POA: Diagnosis not present

## 2019-01-18 DIAGNOSIS — G894 Chronic pain syndrome: Secondary | ICD-10-CM | POA: Diagnosis not present

## 2019-01-18 DIAGNOSIS — Z79899 Other long term (current) drug therapy: Secondary | ICD-10-CM | POA: Diagnosis not present

## 2019-01-18 DIAGNOSIS — Z79891 Long term (current) use of opiate analgesic: Secondary | ICD-10-CM | POA: Diagnosis not present

## 2019-01-18 DIAGNOSIS — M5030 Other cervical disc degeneration, unspecified cervical region: Secondary | ICD-10-CM | POA: Diagnosis not present

## 2019-01-18 DIAGNOSIS — M47817 Spondylosis without myelopathy or radiculopathy, lumbosacral region: Secondary | ICD-10-CM | POA: Diagnosis not present

## 2019-01-19 DIAGNOSIS — M542 Cervicalgia: Secondary | ICD-10-CM | POA: Diagnosis not present

## 2019-01-19 DIAGNOSIS — G894 Chronic pain syndrome: Secondary | ICD-10-CM | POA: Diagnosis not present

## 2019-01-19 DIAGNOSIS — M545 Low back pain: Secondary | ICD-10-CM | POA: Diagnosis not present

## 2019-01-22 DIAGNOSIS — F909 Attention-deficit hyperactivity disorder, unspecified type: Secondary | ICD-10-CM | POA: Diagnosis not present

## 2019-01-22 DIAGNOSIS — R69 Illness, unspecified: Secondary | ICD-10-CM | POA: Diagnosis not present

## 2019-01-22 DIAGNOSIS — F411 Generalized anxiety disorder: Secondary | ICD-10-CM | POA: Diagnosis not present

## 2019-01-24 DIAGNOSIS — G894 Chronic pain syndrome: Secondary | ICD-10-CM | POA: Diagnosis not present

## 2019-01-24 DIAGNOSIS — D509 Iron deficiency anemia, unspecified: Secondary | ICD-10-CM | POA: Diagnosis not present

## 2019-01-24 DIAGNOSIS — I119 Hypertensive heart disease without heart failure: Secondary | ICD-10-CM | POA: Diagnosis not present

## 2019-01-24 DIAGNOSIS — E039 Hypothyroidism, unspecified: Secondary | ICD-10-CM | POA: Diagnosis not present

## 2019-01-24 DIAGNOSIS — H109 Unspecified conjunctivitis: Secondary | ICD-10-CM | POA: Diagnosis not present

## 2019-01-24 DIAGNOSIS — R1013 Epigastric pain: Secondary | ICD-10-CM | POA: Diagnosis not present

## 2019-01-24 DIAGNOSIS — G629 Polyneuropathy, unspecified: Secondary | ICD-10-CM | POA: Diagnosis not present

## 2019-01-24 DIAGNOSIS — I10 Essential (primary) hypertension: Secondary | ICD-10-CM | POA: Diagnosis not present

## 2019-01-24 DIAGNOSIS — R69 Illness, unspecified: Secondary | ICD-10-CM | POA: Diagnosis not present

## 2019-02-08 DIAGNOSIS — E039 Hypothyroidism, unspecified: Secondary | ICD-10-CM | POA: Diagnosis not present

## 2019-02-08 DIAGNOSIS — R69 Illness, unspecified: Secondary | ICD-10-CM | POA: Diagnosis not present

## 2019-02-08 DIAGNOSIS — I10 Essential (primary) hypertension: Secondary | ICD-10-CM | POA: Diagnosis not present

## 2019-02-08 DIAGNOSIS — Z0100 Encounter for examination of eyes and vision without abnormal findings: Secondary | ICD-10-CM | POA: Diagnosis not present

## 2019-02-08 DIAGNOSIS — R1013 Epigastric pain: Secondary | ICD-10-CM | POA: Diagnosis not present

## 2019-02-08 DIAGNOSIS — G894 Chronic pain syndrome: Secondary | ICD-10-CM | POA: Diagnosis not present

## 2019-02-08 DIAGNOSIS — Z0000 Encounter for general adult medical examination without abnormal findings: Secondary | ICD-10-CM | POA: Diagnosis not present

## 2019-02-08 DIAGNOSIS — D509 Iron deficiency anemia, unspecified: Secondary | ICD-10-CM | POA: Diagnosis not present

## 2019-02-08 DIAGNOSIS — Z131 Encounter for screening for diabetes mellitus: Secondary | ICD-10-CM | POA: Diagnosis not present

## 2019-02-15 DIAGNOSIS — G894 Chronic pain syndrome: Secondary | ICD-10-CM | POA: Diagnosis not present

## 2019-02-15 DIAGNOSIS — M47817 Spondylosis without myelopathy or radiculopathy, lumbosacral region: Secondary | ICD-10-CM | POA: Diagnosis not present

## 2019-02-15 DIAGNOSIS — M542 Cervicalgia: Secondary | ICD-10-CM | POA: Diagnosis not present

## 2019-02-15 DIAGNOSIS — M47816 Spondylosis without myelopathy or radiculopathy, lumbar region: Secondary | ICD-10-CM | POA: Diagnosis not present

## 2019-03-16 DIAGNOSIS — Z79891 Long term (current) use of opiate analgesic: Secondary | ICD-10-CM | POA: Diagnosis not present

## 2019-03-16 DIAGNOSIS — M533 Sacrococcygeal disorders, not elsewhere classified: Secondary | ICD-10-CM | POA: Diagnosis not present

## 2019-03-16 DIAGNOSIS — Z79899 Other long term (current) drug therapy: Secondary | ICD-10-CM | POA: Diagnosis not present

## 2019-03-16 DIAGNOSIS — M5137 Other intervertebral disc degeneration, lumbosacral region: Secondary | ICD-10-CM | POA: Diagnosis not present

## 2019-03-16 DIAGNOSIS — G629 Polyneuropathy, unspecified: Secondary | ICD-10-CM | POA: Diagnosis not present

## 2019-03-16 DIAGNOSIS — G894 Chronic pain syndrome: Secondary | ICD-10-CM | POA: Diagnosis not present

## 2019-03-27 DIAGNOSIS — R1013 Epigastric pain: Secondary | ICD-10-CM | POA: Diagnosis not present

## 2019-03-27 DIAGNOSIS — R1032 Left lower quadrant pain: Secondary | ICD-10-CM | POA: Diagnosis not present

## 2019-03-27 DIAGNOSIS — D509 Iron deficiency anemia, unspecified: Secondary | ICD-10-CM | POA: Diagnosis not present

## 2019-03-27 DIAGNOSIS — E039 Hypothyroidism, unspecified: Secondary | ICD-10-CM | POA: Diagnosis not present

## 2019-03-27 DIAGNOSIS — R600 Localized edema: Secondary | ICD-10-CM | POA: Diagnosis not present

## 2019-03-27 DIAGNOSIS — G629 Polyneuropathy, unspecified: Secondary | ICD-10-CM | POA: Diagnosis not present

## 2019-03-27 DIAGNOSIS — R69 Illness, unspecified: Secondary | ICD-10-CM | POA: Diagnosis not present

## 2019-03-27 DIAGNOSIS — I10 Essential (primary) hypertension: Secondary | ICD-10-CM | POA: Diagnosis not present

## 2019-03-27 DIAGNOSIS — G894 Chronic pain syndrome: Secondary | ICD-10-CM | POA: Diagnosis not present

## 2019-04-10 ENCOUNTER — Other Ambulatory Visit: Payer: Self-pay

## 2019-04-10 DIAGNOSIS — Z20822 Contact with and (suspected) exposure to covid-19: Secondary | ICD-10-CM

## 2019-04-11 LAB — NOVEL CORONAVIRUS, NAA: SARS-CoV-2, NAA: NOT DETECTED

## 2019-04-13 DIAGNOSIS — M533 Sacrococcygeal disorders, not elsewhere classified: Secondary | ICD-10-CM | POA: Diagnosis not present

## 2019-04-13 DIAGNOSIS — G629 Polyneuropathy, unspecified: Secondary | ICD-10-CM | POA: Diagnosis not present

## 2019-04-13 DIAGNOSIS — M5137 Other intervertebral disc degeneration, lumbosacral region: Secondary | ICD-10-CM | POA: Diagnosis not present

## 2019-04-13 DIAGNOSIS — G894 Chronic pain syndrome: Secondary | ICD-10-CM | POA: Diagnosis not present

## 2019-04-14 DIAGNOSIS — Z20828 Contact with and (suspected) exposure to other viral communicable diseases: Secondary | ICD-10-CM | POA: Diagnosis not present

## 2019-04-19 DIAGNOSIS — F909 Attention-deficit hyperactivity disorder, unspecified type: Secondary | ICD-10-CM | POA: Diagnosis not present

## 2019-04-19 DIAGNOSIS — R69 Illness, unspecified: Secondary | ICD-10-CM | POA: Diagnosis not present

## 2019-04-19 DIAGNOSIS — F411 Generalized anxiety disorder: Secondary | ICD-10-CM | POA: Diagnosis not present

## 2019-04-30 DIAGNOSIS — R197 Diarrhea, unspecified: Secondary | ICD-10-CM | POA: Diagnosis not present

## 2019-04-30 DIAGNOSIS — R1032 Left lower quadrant pain: Secondary | ICD-10-CM | POA: Diagnosis not present

## 2019-04-30 DIAGNOSIS — R634 Abnormal weight loss: Secondary | ICD-10-CM | POA: Diagnosis not present

## 2019-05-10 DIAGNOSIS — M47817 Spondylosis without myelopathy or radiculopathy, lumbosacral region: Secondary | ICD-10-CM | POA: Diagnosis not present

## 2019-05-10 DIAGNOSIS — M5030 Other cervical disc degeneration, unspecified cervical region: Secondary | ICD-10-CM | POA: Diagnosis not present

## 2019-05-10 DIAGNOSIS — G894 Chronic pain syndrome: Secondary | ICD-10-CM | POA: Diagnosis not present

## 2019-05-10 DIAGNOSIS — M5136 Other intervertebral disc degeneration, lumbar region: Secondary | ICD-10-CM | POA: Diagnosis not present

## 2019-05-15 DIAGNOSIS — R1032 Left lower quadrant pain: Secondary | ICD-10-CM | POA: Diagnosis not present

## 2019-05-15 DIAGNOSIS — R634 Abnormal weight loss: Secondary | ICD-10-CM | POA: Diagnosis not present

## 2019-05-21 DIAGNOSIS — Z20828 Contact with and (suspected) exposure to other viral communicable diseases: Secondary | ICD-10-CM | POA: Diagnosis not present

## 2019-06-06 DIAGNOSIS — G5790 Unspecified mononeuropathy of unspecified lower limb: Secondary | ICD-10-CM | POA: Diagnosis not present

## 2019-06-06 DIAGNOSIS — G894 Chronic pain syndrome: Secondary | ICD-10-CM | POA: Diagnosis not present

## 2019-06-06 DIAGNOSIS — M5137 Other intervertebral disc degeneration, lumbosacral region: Secondary | ICD-10-CM | POA: Diagnosis not present

## 2019-06-06 DIAGNOSIS — M47817 Spondylosis without myelopathy or radiculopathy, lumbosacral region: Secondary | ICD-10-CM | POA: Diagnosis not present

## 2019-06-10 ENCOUNTER — Encounter (HOSPITAL_COMMUNITY): Payer: Self-pay

## 2019-06-10 ENCOUNTER — Ambulatory Visit (HOSPITAL_COMMUNITY)
Admission: EM | Admit: 2019-06-10 | Discharge: 2019-06-10 | Disposition: A | Discharge status: Home / Self Care | Payer: Medicare HMO | Attending: Family Medicine | Admitting: Family Medicine

## 2019-06-10 ENCOUNTER — Other Ambulatory Visit: Payer: Self-pay

## 2019-06-10 DIAGNOSIS — Z20822 Contact with and (suspected) exposure to covid-19: Secondary | ICD-10-CM | POA: Insufficient documentation

## 2019-06-10 DIAGNOSIS — Z79899 Other long term (current) drug therapy: Secondary | ICD-10-CM | POA: Insufficient documentation

## 2019-06-10 DIAGNOSIS — R519 Headache, unspecified: Secondary | ICD-10-CM | POA: Insufficient documentation

## 2019-06-10 DIAGNOSIS — R509 Fever, unspecified: Secondary | ICD-10-CM | POA: Insufficient documentation

## 2019-06-10 DIAGNOSIS — Z8673 Personal history of transient ischemic attack (TIA), and cerebral infarction without residual deficits: Secondary | ICD-10-CM | POA: Diagnosis not present

## 2019-06-10 DIAGNOSIS — F329 Major depressive disorder, single episode, unspecified: Secondary | ICD-10-CM | POA: Diagnosis not present

## 2019-06-10 DIAGNOSIS — G629 Polyneuropathy, unspecified: Secondary | ICD-10-CM | POA: Insufficient documentation

## 2019-06-10 DIAGNOSIS — Z7982 Long term (current) use of aspirin: Secondary | ICD-10-CM | POA: Diagnosis not present

## 2019-06-10 LAB — POC SARS CORONAVIRUS 2 AG: SARS Coronavirus 2 Ag: NEGATIVE

## 2019-06-10 LAB — POC SARS CORONAVIRUS 2 AG -  ED: SARS Coronavirus 2 Ag: NEGATIVE

## 2019-06-10 NOTE — Discharge Instructions (Addendum)
Quarantine until you get your test results Tylenol for pain and fever May take OTC cough and cold medicine  Check for results on My Chart.

## 2019-06-10 NOTE — ED Triage Notes (Addendum)
Pt states she woke up with headache and fever.  Pt states this started today. Pt states she has been tested for Covid  3 weeks ago due to exposure. ( negative ) at that time.

## 2019-06-10 NOTE — ED Provider Notes (Signed)
[facility]    CSN: [id number] Arrival date & time: 06/10/19  1044      History   Chief Complaint Chief Complaint  Patient presents with  . Headache  . Fever    HPI [name] is a 54 y.o. female.   HPI  Patient states that she was around her aunt Thursday and Friday of last week.  Today she woke up with fever and headache.  She has found out that her aunt tested positive for Covid.  Her temperature this morning was 101.  9.  She took 2 Excedrin lying down full bit.  The temperature came down.  The fever came down.  She is here requesting Covid testing. Patient is disabled from working because of a polyneuropathy.  Also suffers from depression and ADD.  She is on chronic pain medicine, Adderall Abilify Wellbutrin and Cymbalta.  Past Medical History:  Diagnosis Date  . ADD (attention deficit disorder)   . Depression   . History of gastric bypass   . Neuropathy   . Spinal cord stimulator status   . TIA (transient ischemic attack)     Patient Active Problem List   Diagnosis Date Noted  . Colitis 02/26/2018  . Spinal cord stimulator status   . UNSPECIFIED TACHYCARDIA 01/14/2010  . SHORTNESS OF BREATH 01/14/2010  . PAROXYSMAL SUPRAVENTRICULAR TACHYCARDIA 12/22/2009    Past Surgical History:  Procedure Laterality Date  . ABDOMINAL HYSTERECTOMY    . ANKLE SURGERY Left   . BACK SURGERY    . CHOLECYSTECTOMY    . EYE SURGERY    . GASTRIC BYPASS    . TONSILLECTOMY      OB History    Gravida  2   Para  2   Term      Preterm      AB      Living  2     SAB      TAB      Ectopic      Multiple      Live Births               Home Medications    Prior to Admission medications   Medication Sig Start Date End Date Taking? Authorizing Provider  amphetamine-dextroamphetamine (ADDERALL) 20 MG tablet Take 20 mg by mouth 3 (three) times daily.    [provider]  ARIPiprazole (ABILIFY) 5 MG tablet Take 5 mg by mouth 2 (two) times  daily.     [provider]  aspirin EC 81 MG tablet Take 81 mg by mouth daily.    [provider]  buPROPion (WELLBUTRIN XL) 150 MG 24 hr tablet Take 150 mg by mouth daily.    [provider]  DULoxetine (CYMBALTA) 60 MG capsule Take 60 mg by mouth daily.    [provider]  fentaNYL (DURAGESIC - DOSED MCG/HR) 50 MCG/HR Place 1 patch onto the skin every other day.     [provider]  gabapentin (NEURONTIN) 800 MG tablet Take 800 mg by mouth 4 (four) times daily.     [provider]  ibuprofen (ADVIL,MOTRIN) 200 MG tablet Take 400-600 mg by mouth every 6 (six) hours as needed for pain.    [provider]  levothyroxine (SYNTHROID, LEVOTHROID) 25 MCG tablet Take 25 mcg by mouth daily before breakfast.    [provider]  lidocaine (LIDODERM) 5 % Place 1 patch onto the skin daily as needed (pain). Remove & Discard patch within  12 hours or as directed by MD    [provider]  nortriptyline (PAMELOR) 75 MG capsule Take 150 mg by mouth at bedtime.    [provider]  oxyCODONE-acetaminophen (PERCOCET) 10-325 MG tablet Take 1 tablet by mouth 2 (two) times daily.    [provider]    Family History Family History  Problem Relation Age of Onset  . Cancer Mother        MELANOMA  . Cancer Father        THYROID  . Hypertension Father   . Diabetes Maternal Aunt   . Heart disease Maternal Aunt   . Diabetes Paternal Uncle   . Cancer Maternal Grandmother        LUNG- SMOKER  . Cancer Maternal Grandfather        BONE   . Cancer Paternal Grandmother        SKIN- BASAL CELL    Social History Social History   Tobacco Use  . Smoking status: Never Smoker  . Smokeless tobacco: Never Used  Substance Use Topics  . Alcohol use: Yes    Alcohol/week: 0.0 standard drinks    Comment: OCC  . Drug use: No     Allergies   Lactose intolerance (gi), Other, Apple fruit extract, Iodine, and Shellfish  allergy   Review of Systems Review of Systems  Constitutional: Positive for fever. Negative for chills.  HENT: Negative for congestion and hearing loss.   Eyes: Negative for photophobia and pain.  Respiratory: Negative for cough and shortness of breath.   Cardiovascular: Negative for chest pain and leg swelling.  Gastrointestinal: Negative for abdominal pain, constipation, diarrhea and nausea.  Genitourinary: Negative for dysuria and frequency.  Musculoskeletal: Negative for myalgias.  Neurological: Positive for headaches. Negative for dizziness and seizures.  Psychiatric/Behavioral: The patient is not nervous/anxious.      Physical Exam Triage Vital Signs ED Triage Vitals  Enc Vitals Group     BP 06/10/19 1233 115/73     Pulse Rate 06/10/19 1233 66     Resp 06/10/19 1233 18     Temp 06/10/19 1233 99.3 F (37.4 C)     Temp Source 06/10/19 1233 Oral     SpO2 06/10/19 1233 99 %     Weight 06/10/19 1256 155 lb (70.3 kg)     Height --      Head Circumference --      Peak Flow --      Pain Score 06/10/19 1256 6     Pain Loc --      Pain Edu? --      Excl. in GC? --    No data found.  Updated Vital Signs BP 115/73 (BP Location: Right Arm)   Pulse 66   Temp 99.3 F (37.4 C) (Oral)   Resp 18   Wt 70.3 kg   SpO2 99%   BMI 25.02 kg/m   Visual Acuity Right Eye Distance:   Left Eye Distance:   Bilateral Distance:    Right Eye Near:   Left Eye Near:    Bilateral Near:     Physical Exam Constitutional:      General: She is not in acute distress.    Appearance: She is well-developed and normal weight.  HENT:     Head: Normocephalic and atraumatic.     Comments: Mask in place Eyes:     Conjunctiva/sclera: Conjunctivae normal.     Pupils: Pupils are equal, round, and reactive to light.  Cardiovascular:     Rate and Rhythm: Normal rate and regular rhythm.  Pulmonary:     Effort: Pulmonary effort is normal. No respiratory distress.     Breath sounds: Normal  breath sounds.  Abdominal:     General: There is no distension.     Palpations: Abdomen is soft.  Musculoskeletal:        General: Normal range of motion.     Cervical back: Normal range of motion and neck supple.  Skin:    General: Skin is warm and dry.  Neurological:     General: No focal deficit present.     Mental Status: She is alert.  Psychiatric:        Mood and Affect: Mood normal.        Behavior: Behavior normal.      UC Treatments / Results  Labs (all labs ordered are listed, but only abnormal results are displayed) Labs Reviewed  NOVEL CORONAVIRUS, NAA (HOSP ORDER, SEND-OUT TO REF LAB; TAT 18-24 HRS)  POC SARS CORONAVIRUS 2 AG -  ED  POC SARS CORONAVIRUS 2 AG    EKG   Radiology No results found.  Procedures Procedures (including critical care time)  Medications Ordered in UC Medications - No data to display  Initial Impression / Assessment and Plan / UC Course  I have reviewed the triage vital signs and the nursing notes.  Pertinent labs & imaging results that were available during my care of the patient were reviewed by me and considered in my medical decision making (see chart for details).     Discussed COVID-19.  Signs and symptoms.  Transmission.  Test results.  Need to quarantine Final Clinical Impressions(s) / UC Diagnoses   Final diagnoses:  Contact with and (suspected) exposure to covid-19     Discharge Instructions     Quarantine until you get your test results Tylenol for pain and fever May take OTC cough and cold medicine  Check for results on My Chart.   ED Prescriptions    None     I have reviewed the PDMP during this encounter.   Raylene Everts, MD 06/10/19 [phone]

## 2019-06-11 LAB — NOVEL CORONAVIRUS, NAA (HOSP ORDER, SEND-OUT TO REF LAB; TAT 18-24 HRS): SARS-CoV-2, NAA: NOT DETECTED

## 2019-06-27 IMAGING — MR MRI OF THE LEFT SHOULDER WITHOUT CONTRAST
5 series · 40 of 40 positions shown · non-contrast
Comparison: None.

CLINICAL DATA: Left shoulder and arm pain, numbness and tingling
since a fall in March 2018. Left shoulder weakness and limited
range of motion.

EXAM:
MRI OF THE LEFT SHOULDER WITHOUT CONTRAST
TECHNIQUE: Multiplanar, multisequence MR imaging of the shoulder was performed.
No intravenous contrast was administered.

[Series 3: PD fat-sat · axial · 4.0mm · 0.27mm/px · z∈[-44,+46]mm · 8 of 20 slices shown (1 of 2)]
[im 1/20]
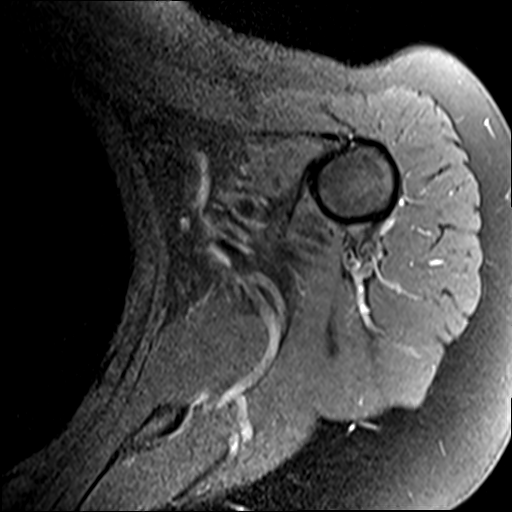
[im 3/20]
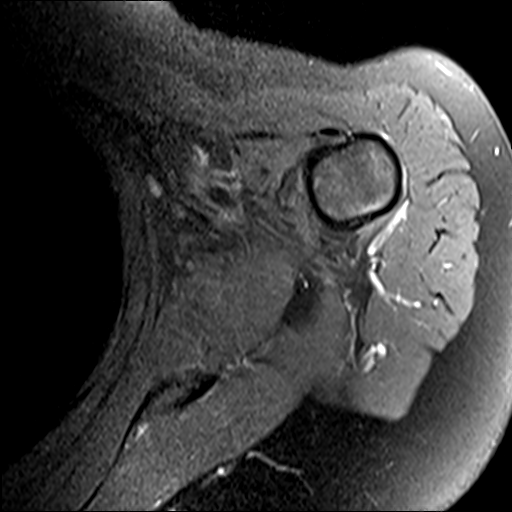
[im 6/20]
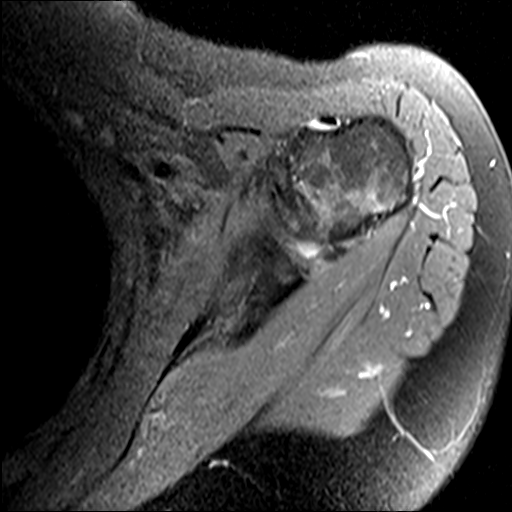
[im 9/20]
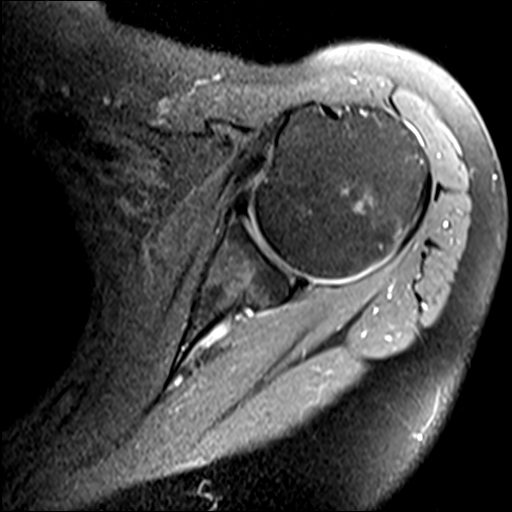
[im 11/20]
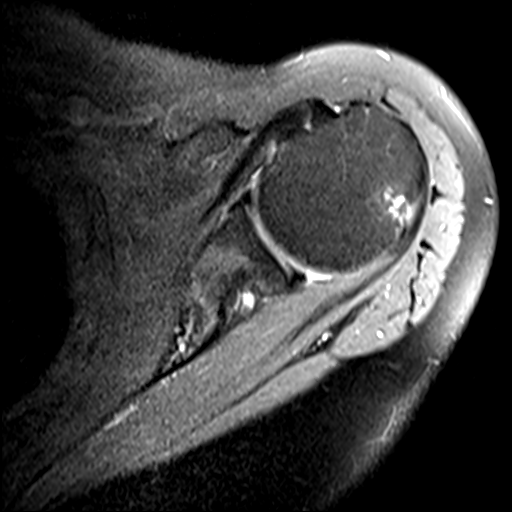
[im 14/20]
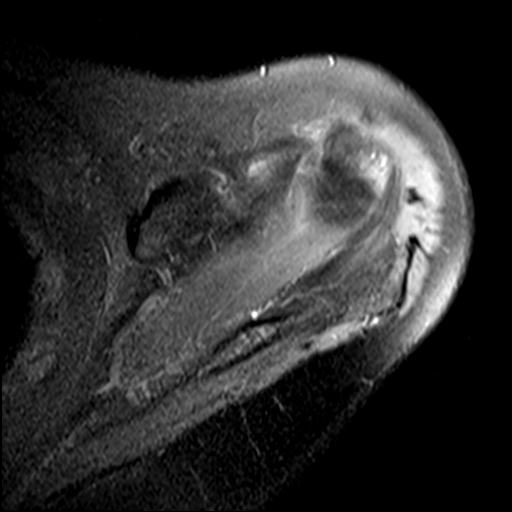
[im 17/20]
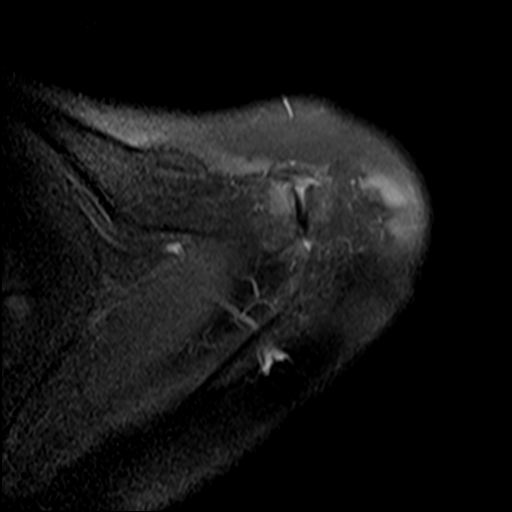
[im 20/20]
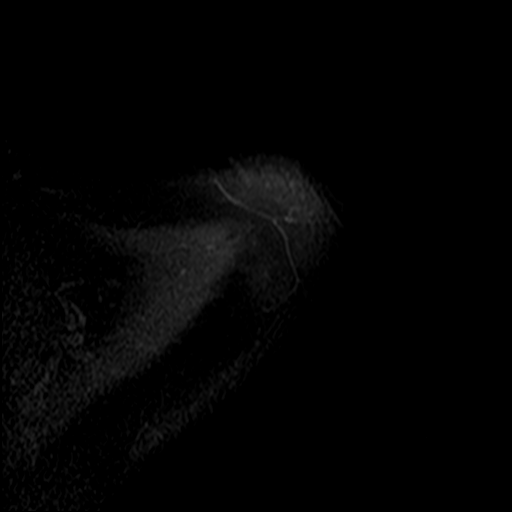

[Series 4: T2 fat-sat · oblique · 4.0mm · 0.59mm/px · 8 of 18 slices shown (1 of 2)]
[im 1/18]
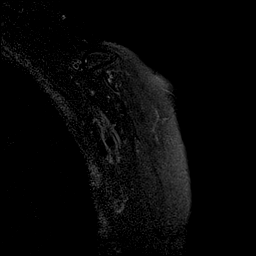
[im 3/18]
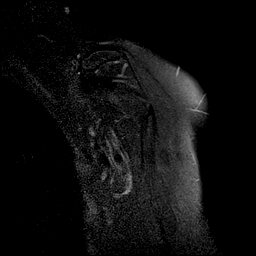
[im 5/18]
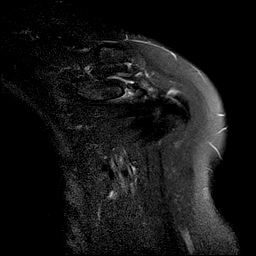
[im 8/18]
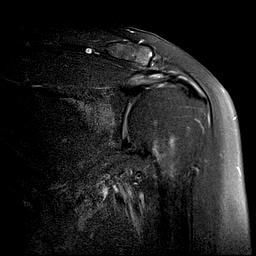
[im 10/18]
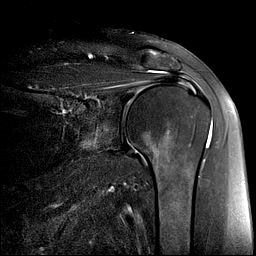
[im 13/18]
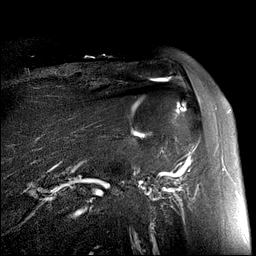
[im 15/18]
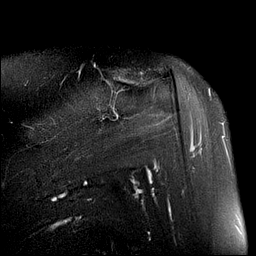
[im 18/18]
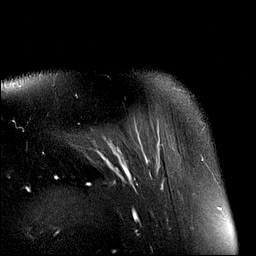

[Series 5: PD fat-sat · oblique · 4.0mm · 0.59mm/px · 8 of 18 slices shown (2 of 2)]
[im 1/18]
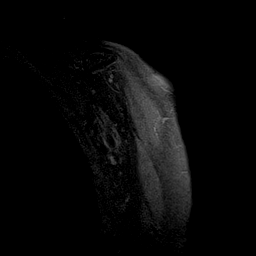
[im 3/18]
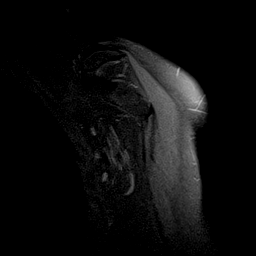
[im 5/18]
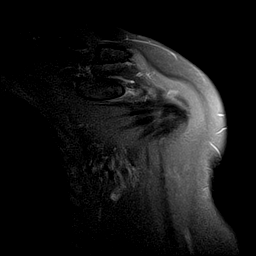
[im 8/18]
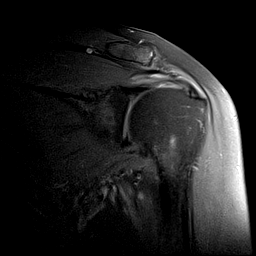
[im 10/18]
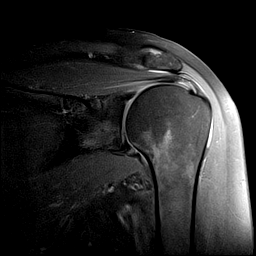
[im 13/18]
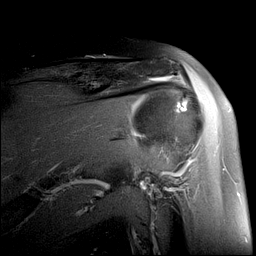
[im 15/18]
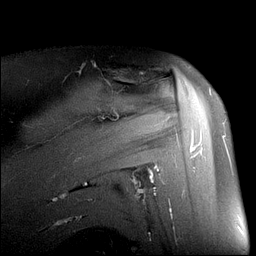
[im 18/18]
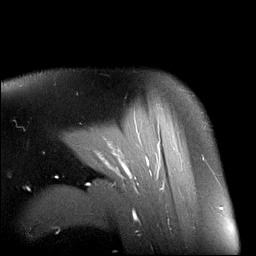

[Series 6: T2 fat-sat · oblique · 4.0mm · 0.59mm/px · 8 of 20 slices shown (2 of 2)]
[im 1/20]
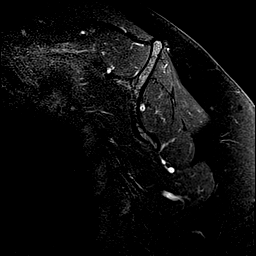
[im 3/20]
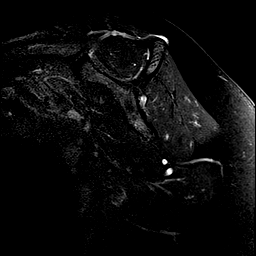
[im 6/20]
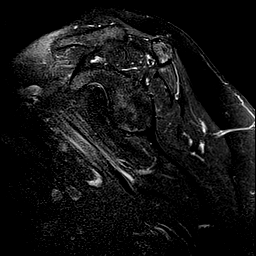
[im 9/20]
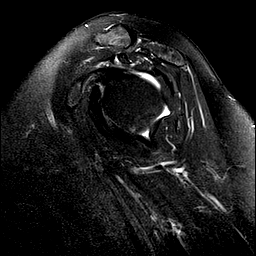
[im 11/20]
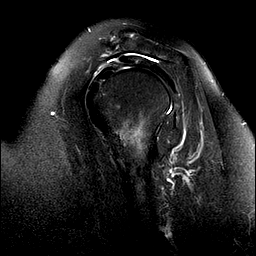
[im 14/20]
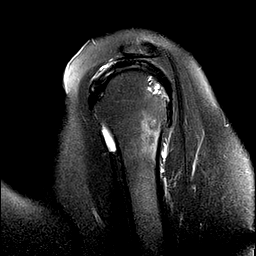
[im 17/20]
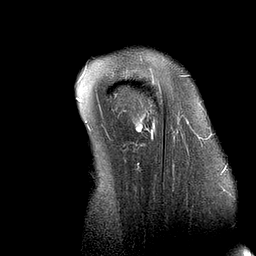
[im 20/20]
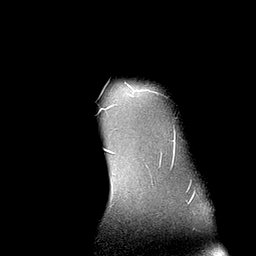

[Series 7: T1 · oblique · 4.0mm · 0.59mm/px · 8 of 20 slices shown]
[im 1/20]
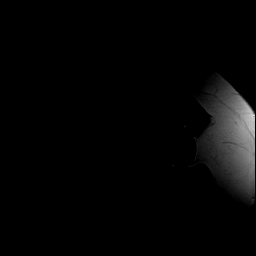
[im 3/20]
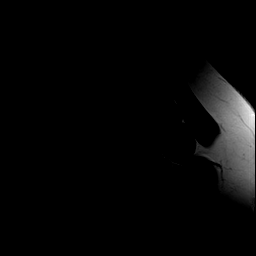
[im 6/20]
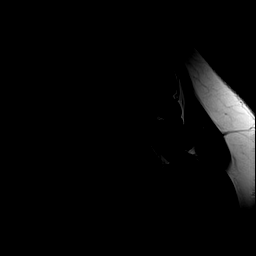
[im 9/20]
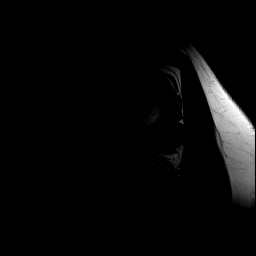
[im 11/20]
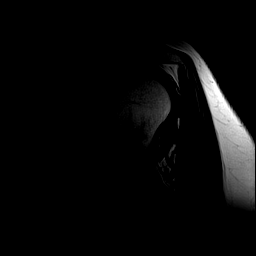
[im 14/20]
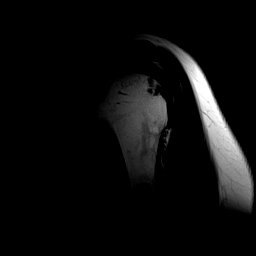
[im 17/20]
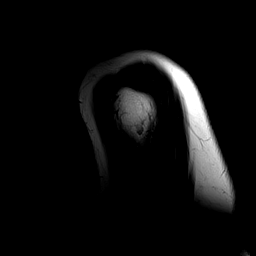
[im 20/20]
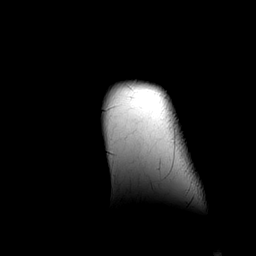

[40 of 40 positions shown; findings below may reference images not displayed]

FINDINGS: Rotator cuff: Intact. A few small fissures in the leading edge of
the infraspinatus are noted.

Muscles:  Normal without atrophy or focal lesion.

Biceps long head:  Intact.

Acromioclavicular Joint: Normal. Type 1 acromion. Small volume of
fluid is present subacromial/subdeltoid bursa.

Glenohumeral Joint: Appears normal.

Labrum:  Intact.

Bones:  No fracture or worrisome lesion.

Other: None.
IMPRESSION: 2-3 small fissures are seen in the leading edge of the
infraspinatus. Negative for rotator cuff, long head of biceps or
labral tear.

Small volume of subacromial/subdeltoid fluid could be due to
bursitis.

## 2019-08-24 ENCOUNTER — Other Ambulatory Visit: Payer: Self-pay

## 2019-08-24 ENCOUNTER — Ambulatory Visit: Payer: Medicare HMO | Attending: Internal Medicine

## 2019-08-24 DIAGNOSIS — Z23 Encounter for immunization: Secondary | ICD-10-CM

## 2019-08-24 NOTE — Progress Notes (Signed)
   Covid-19 Vaccination Clinic  Name:  [name]    MRN: [id number] DOB: 03-22-1966  08/24/2019  [name] was observed post Covid-19 immunization for 15 minutes without incident. She was provided with Vaccine Information Sheet and instruction to access the V-Safe system.   [name] was instructed to call [phone] with any severe reactions post vaccine: Marland Kitchen Difficulty breathing  . Swelling of face and throat  . A fast heartbeat  . A bad rash all over body  . Dizziness and weakness   Immunizations Administered    Name Date Dose VIS Date Route   Pfizer COVID-19 Vaccine 08/24/2019 10:00 AM 0.3 mL 05/18/2019 Intramuscular   Manufacturer: [employer], [employer]   Lot: [id number]   NDC: [address]-4

## 2019-09-18 ENCOUNTER — Ambulatory Visit: Payer: Medicare HMO | Attending: Internal Medicine

## 2019-09-18 DIAGNOSIS — Z23 Encounter for immunization: Secondary | ICD-10-CM

## 2019-09-18 NOTE — Progress Notes (Signed)
   Covid-19 Vaccination Clinic  Name:  [name]    MRN: [id number] DOB: 06-Feb-1966  09/18/2019  [name] was observed post Covid-19 immunization for 15 minutes without incident. She was provided with Vaccine Information Sheet and instruction to access the V-Safe system.   [name] was instructed to call 911 with any severe reactions post vaccine: Marland Kitchen Difficulty breathing  . Swelling of face and throat  . A fast heartbeat  . A bad rash all over body  . Dizziness and weakness   Immunizations Administered    Name Date Dose VIS Date Route   Pfizer COVID-19 Vaccine 09/18/2019 11:20 AM 0.3 mL 05/18/2019 Intramuscular   Manufacturer: [employer], [employer]   Lot: [id number]   NDC: [address]-4

## 2019-12-08 ENCOUNTER — Emergency Department (HOSPITAL_COMMUNITY): Payer: Medicare HMO

## 2019-12-08 ENCOUNTER — Emergency Department (HOSPITAL_COMMUNITY)
Admission: EM | Admit: 2019-12-08 | Discharge: 2019-12-08 | Disposition: A | Discharge status: Home / Self Care | Payer: Medicare HMO | Attending: Emergency Medicine | Admitting: Emergency Medicine

## 2019-12-08 ENCOUNTER — Other Ambulatory Visit: Payer: Self-pay

## 2019-12-08 ENCOUNTER — Encounter (HOSPITAL_COMMUNITY): Payer: Self-pay | Admitting: Emergency Medicine

## 2019-12-08 DIAGNOSIS — M79602 Pain in left arm: Secondary | ICD-10-CM | POA: Diagnosis not present

## 2019-12-08 DIAGNOSIS — Z7982 Long term (current) use of aspirin: Secondary | ICD-10-CM | POA: Diagnosis not present

## 2019-12-08 DIAGNOSIS — Z79899 Other long term (current) drug therapy: Secondary | ICD-10-CM | POA: Diagnosis not present

## 2019-12-08 DIAGNOSIS — R519 Headache, unspecified: Secondary | ICD-10-CM

## 2019-12-08 DIAGNOSIS — Z8673 Personal history of transient ischemic attack (TIA), and cerebral infarction without residual deficits: Secondary | ICD-10-CM | POA: Diagnosis not present

## 2019-12-08 DIAGNOSIS — I10 Essential (primary) hypertension: Secondary | ICD-10-CM | POA: Diagnosis not present

## 2019-12-08 LAB — CBC WITH DIFFERENTIAL/PLATELET
Abs Immature Granulocytes: 0.02 10*3/uL (ref 0.00–0.07)
Basophils Absolute: 0.1 10*3/uL (ref 0.0–0.1)
Basophils Relative: 1 %
Eosinophils Absolute: 0.3 10*3/uL (ref 0.0–0.5)
Eosinophils Relative: 5 %
HCT: 35.6 % — ABNORMAL LOW (ref 36.0–46.0)
Hemoglobin: 10.9 g/dL — ABNORMAL LOW (ref 12.0–15.0)
Immature Granulocytes: 0 %
Lymphocytes Relative: 30 %
Lymphs Abs: 1.9 10*3/uL (ref 0.7–4.0)
MCH: 27.5 pg (ref 26.0–34.0)
MCHC: 30.6 g/dL (ref 30.0–36.0)
MCV: 89.9 fL (ref 80.0–100.0)
Monocytes Absolute: 0.4 10*3/uL (ref 0.1–1.0)
Monocytes Relative: 6 %
Neutro Abs: 3.5 10*3/uL (ref 1.7–7.7)
Neutrophils Relative %: 58 %
Platelets: 314 10*3/uL (ref 150–400)
RBC: 3.96 MIL/uL (ref 3.87–5.11)
RDW: 13.3 % (ref 11.5–15.5)
WBC: 6.1 10*3/uL (ref 4.0–10.5)
nRBC: 0 % (ref 0.0–0.2)

## 2019-12-08 LAB — BASIC METABOLIC PANEL
Anion gap: 9 (ref 5–15)
BUN: 12 mg/dL (ref 6–20)
CO2: 29 mmol/L (ref 22–32)
Calcium: 9.1 mg/dL (ref 8.9–10.3)
Chloride: 100 mmol/L (ref 98–111)
Creatinine, Ser: 0.99 mg/dL (ref 0.44–1.00)
GFR calc Af Amer: >60 mL/min (ref >60)
GFR calc non Af Amer: >60 mL/min (ref >60)
Glucose, Bld: 89 mg/dL (ref 70–99)
Potassium: 3.3 mmol/L — ABNORMAL LOW (ref 3.5–5.1)
Sodium: 138 mmol/L (ref 135–145)

## 2019-12-08 MED ORDER — IBUPROFEN 400 MG PO TABS
600.0000 mg | ORAL_TABLET | Freq: Once | ORAL | Status: AC
  Administered 2019-12-08: 600 mg via ORAL
  Filled 2019-12-08: qty 1

## 2019-12-08 NOTE — ED Triage Notes (Signed)
Pt. Stated, I was having HTN this morning in the 200's. Im also having left arm pain, and have a headache.

## 2019-12-08 NOTE — ED Provider Notes (Signed)
[facility] EMERGENCY DEPARTMENT Provider Note   CSN: [id number] Arrival date & time: 12/08/19  1141     History Chief Complaint  Patient presents with  . Hypertension  . Arm Pain  . Headache    [name] is a 54 y.o. female.  HPI 54 year old female with a history of depression, ADD, TIA, neuropathy in her hands and feet presents to the ER with complaints of high blood pressures, left arm pain and headache.  Patient states that around 5 AM this morning, she woke up with some left arm pain and a headache.  She reports feeling like there is pressure behind her eyes.  No significant vision changes, though she feels like her head is pulsating.  She also states that she feels left arm pain starting in her armpit that goes down to her fingertips.  She states that around 5 AM she took her blood pressure and it was approximately 200/100.  She had not taken her blood pressures medication yet.  She then rechecked it 2 hours later and it was still elevated around the same level.  She then took her blood pressure medication and her blood pressure improved.  She denies any chest pain, shortness of breath, dizziness, unilateral weakness.  She states she has neck pain and back pain but this is chronic and unchanged.  She denies any fevers or chills, nausea or vomiting.  She states that she does feel like her legs are swollen.  No recent travel, surgeries.  Not on anticoagulation.    Past Medical History:  Diagnosis Date  . ADD (attention deficit disorder)   . Depression   . History of gastric bypass   . Neuropathy   . Spinal cord stimulator status   . TIA (transient ischemic attack)     Patient Active Problem List   Diagnosis Date Noted  . Colitis 02/26/2018  . Spinal cord stimulator status   . UNSPECIFIED TACHYCARDIA 01/14/2010  . SHORTNESS OF BREATH 01/14/2010  . PAROXYSMAL SUPRAVENTRICULAR TACHYCARDIA 12/22/2009    Past Surgical History:  Procedure Laterality Date    . ABDOMINAL HYSTERECTOMY    . ANKLE SURGERY Left   . BACK SURGERY    . CHOLECYSTECTOMY    . EYE SURGERY    . GASTRIC BYPASS    . TONSILLECTOMY       OB History    Gravida  2   Para  2   Term      Preterm      AB      Living  2     SAB      TAB      Ectopic      Multiple      Live Births              Family History  Problem Relation Age of Onset  . Cancer Mother        MELANOMA  . Cancer Father        THYROID  . Hypertension Father   . Diabetes Maternal Aunt   . Heart disease Maternal Aunt   . Diabetes Paternal Uncle   . Cancer Maternal Grandmother        LUNG- SMOKER  . Cancer Maternal Grandfather        BONE   . Cancer Paternal Grandmother        SKIN- BASAL CELL    Social History   Tobacco Use  . Smoking status: Never Smoker  . Smokeless  tobacco: Never Used  Substance Use Topics  . Alcohol use: Yes    Alcohol/week: 0.0 standard drinks    Comment: OCC  . Drug use: No    Home Medications Prior to Admission medications   Medication Sig Start Date End Date Taking? Authorizing Provider  amphetamine-dextroamphetamine (ADDERALL) 20 MG tablet Take 20 mg by mouth 3 (three) times daily.    [provider]  ARIPiprazole (ABILIFY) 5 MG tablet Take 5 mg by mouth 2 (two) times daily.     [provider]  aspirin EC 81 MG tablet Take 81 mg by mouth daily.    [provider]  buPROPion (WELLBUTRIN XL) 150 MG 24 hr tablet Take 150 mg by mouth daily.    [provider]  DULoxetine (CYMBALTA) 60 MG capsule Take 60 mg by mouth daily.    [provider]  fentaNYL (DURAGESIC - DOSED MCG/HR) 50 MCG/HR Place 1 patch onto the skin every other day.     [provider]  gabapentin (NEURONTIN) 800 MG tablet Take 800 mg by mouth 4 (four) times daily.     [provider]  ibuprofen (ADVIL,MOTRIN) 200 MG tablet Take 400-600 mg by mouth every 6 (six) hours as needed for pain.    [provider]   levothyroxine (SYNTHROID, LEVOTHROID) 25 MCG tablet Take 25 mcg by mouth daily before breakfast.    [provider]  lidocaine (LIDODERM) 5 % Place 1 patch onto the skin daily as needed (pain). Remove & Discard patch within 12 hours or as directed by MD    [provider]  nortriptyline (PAMELOR) 75 MG capsule Take 150 mg by mouth at bedtime.    [provider]  oxyCODONE-acetaminophen (PERCOCET) 10-325 MG tablet Take 1 tablet by mouth 2 (two) times daily.    [provider]    Allergies    Lactose intolerance (gi), Other, Apple fruit extract, Iodine, and Shellfish allergy  Review of Systems   Review of Systems  Constitutional: Negative for chills and fever.  HENT: Negative for ear pain and sore throat.   Eyes: Negative for pain and visual disturbance.  Respiratory: Negative for cough and shortness of breath.   Cardiovascular: Negative for chest pain and palpitations.  Gastrointestinal: Negative for abdominal pain and vomiting.  Genitourinary: Negative for dysuria and hematuria.  Musculoskeletal: Negative for arthralgias and back pain.  Skin: Negative for color change and rash.  Neurological: Positive for headaches. Negative for seizures and syncope.  Psychiatric/Behavioral: Negative for confusion.  All other systems reviewed and are negative.   Physical Exam Updated Vital Signs BP 127/78 (BP Location: Right Arm)   Pulse 65   Temp 98.4 F (36.9 C) (Oral)   Resp 16   SpO2 98%   Physical Exam Vitals and nursing note reviewed.  Constitutional:      General: She is not in acute distress.    Appearance: She is well-developed. She is not ill-appearing, toxic-appearing or diaphoretic.  HENT:     Head: Normocephalic and atraumatic.  Eyes:     Conjunctiva/sclera: Conjunctivae normal.  Cardiovascular:     Rate and Rhythm: Normal rate and regular rhythm.     Heart sounds: Normal heart sounds. No murmur heard.      Comments: 2+ radial and DP  pulses Pulmonary:     Effort: Pulmonary effort is normal. No respiratory distress.     Breath sounds: Normal breath sounds.  Abdominal:     Palpations: Abdomen is soft.  Tenderness: There is no abdominal tenderness.  Musculoskeletal:     Cervical back: Normal range of motion and neck supple. No rigidity.     Comments: No midline C, T, or Lspine tenderness. Moving all four extremities without difficulty. No notable edema or erythema to lower extremities. No popliteal tenderness.   Lymphadenopathy:     Cervical: No cervical adenopathy.  Skin:    General: Skin is warm and dry.  Neurological:     Mental Status: She is alert.     Cranial Nerves: No cranial nerve deficit.     Comments: Mental Status:  Alert, thought content appropriate, able to give a coherent history. Speech fluent without evidence of aphasia. Able to follow 2 step commands without difficulty.  Cranial Nerves:  II: Peripheral visual fields grossly normal, pupils equal, round, reactive to light III,IV, VI: ptosis not present, extra-ocular motions intact bilaterally  V,VII: smile symmetric, facial light touch sensation equal VIII: hearing grossly normal to voice  X: uvula elevates symmetrically  XI: bilateral shoulder shrug symmetric and strong XII: midline tongue extension without fassiculations Motor:  Normal tone. 5/5 strength of BUE and BLE major muscle groups including strong and equal grip strength and dorsiflexion/plantar flexion Sensory: light touch normal in all extremities. Cerebellar: normal finger-to-nose with bilateral upper extremities, Romberg sign absent Gait: not accessed     ED Results / Procedures / Treatments   Labs (all labs ordered are listed, but only abnormal results are displayed) Labs Reviewed  CBC WITH DIFFERENTIAL/PLATELET - Abnormal; Notable for the following components:      Result Value   Hemoglobin 10.9 (*)    HCT 35.6 (*)    All other components within normal limits  BASIC  METABOLIC PANEL - Abnormal; Notable for the following components:   Potassium 3.3 (*)    All other components within normal limits    EKG EKG Interpretation  Date/Time:  Saturday December 08 2019 11:47:17 EDT Ventricular Rate:  69 PR Interval:  154 QRS Duration: 84 QT Interval:  390 QTC Calculation: 417 R Axis:   29 Text Interpretation: Normal sinus rhythm Normal ECG No significant change since last tracing Confirmed by Susy FrizzleSheldon, Charles [phone]([id number]) on 12/08/2019 11:51:00 AM   Radiology DG Chest 2 View  Result Date: 12/08/2019 CLINICAL DATA:  Headache and left arm pain today. EXAM: CHEST - 2 VIEW COMPARISON:  February 26, 2013 FINDINGS: The heart size and mediastinal contours are within normal limits. Both lungs are clear. Spinal stimulator leads are noted. There is scoliosis of spine. IMPRESSION: No active cardiopulmonary disease. Electronically Signed   By: Sherian ReinWei-Chen  Lin M.D.   On: 12/08/2019 13:43    Procedures Procedures (including critical care time)  Medications Ordered in ED Medications  ibuprofen (ADVIL) tablet 600 mg (600 mg Oral Given 12/08/19 1403)    ED Course  I have reviewed the triage vital signs and the nursing notes.  Pertinent labs & imaging results that were available during my care of the patient were reviewed by me and considered in my medical decision making (see chart for details).    MDM Rules/Calculators/A&P                         54 year old female with reports of high blood pressure, headache and left arm pain which started this morning On presentation, the patient is alert and oriented, nontoxic-appearing, in no acute distress, speaking in  full sentences without increased work of breathing, nondiaphoretic.  Physical exam without  any acute abnormalities, patient reports swelling to lower extremities so I do not note any swelling, erythema.  Doubt DVT. Lungs clear, abdomen soft and non-tender. Full strength and ROM in upper and lower extremities. 2+ radial and  DP pulses.  Her vitals on arrival showed a blood pressure of 127/78, pulse of 65 respirations 16, and O2 sat of 98.  No fever.  BMP without significant electrolyte abnormalities, normal BUN/creatinine.  CBC without leukocytosis.  EKG normal sinus rhythm.  Patient denies any chest pain or shortness of breath.  Chest x-ray without acute abnormalities, no evidence of widened mediastinum. She does endorse back pain but this is chronic and unchanged.  My suspicion for ACS, PE, dissection low right now.  No evidence of hypertensive urgency/emergency.  No gross neurologic abnormalities.  Suspicion for stroke/intracranial abnormality low.  Patient's blood pressure looks much improved after taking her blood pressure medication.  Do not think she needs any additional interventions or work-up at this time.  Encouraged her to follow-up with her PCP about the symptoms she is experiencing today.  Patient received ibuprofen in the ER and noted improvement in her headache.  Return precautions given.  At this stage in the ED course, patient is medically screened and stable for discharge.  I discussed the case with Dr. Bernette Mayers who is agreeable to the above plan and disposition.    Final Clinical Impression(s) / ED Diagnoses Final diagnoses:  Hypertension, unspecified type  Left arm pain  Bad headache    Rx / DC Orders ED Discharge Orders    None       Leone Brand 12/08/19 1410    Pollyann Savoy, MD 12/08/19 [phone]

## 2019-12-08 NOTE — Discharge Instructions (Signed)
Your work-up today was overall very reassuring.  Please make sure to follow-up with your primary care doctor about the symptoms you are experiencing today.  Please make sure to take your high blood pressure medication, take Tylenol or ibuprofen for headache.  Return to the ER if your symptoms worsen.

## 2020-04-05 ENCOUNTER — Ambulatory Visit (HOSPITAL_COMMUNITY)
Admission: EM | Admit: 2020-04-05 | Discharge: 2020-04-05 | Disposition: A | Discharge status: Home / Self Care | Payer: Medicare HMO | Attending: Family Medicine | Admitting: Family Medicine

## 2020-04-05 ENCOUNTER — Encounter (HOSPITAL_COMMUNITY): Payer: Self-pay

## 2020-04-05 DIAGNOSIS — S61227A Laceration with foreign body of left little finger without damage to nail, initial encounter: Secondary | ICD-10-CM

## 2020-04-05 DIAGNOSIS — S61213A Laceration without foreign body of left middle finger without damage to nail, initial encounter: Secondary | ICD-10-CM | POA: Diagnosis not present

## 2020-04-05 MED ORDER — LIDOCAINE HCL 2 % IJ SOLN
INTRAMUSCULAR | Status: AC
  Filled 2020-04-05: qty 20

## 2020-04-05 NOTE — ED Triage Notes (Addendum)
Pt present laceration to the right hand and possible has glass in her right pinky. Pt states she was dumpsters diving and cut her self with a mirror.  Pt is up to date with tetanus according to her immunization records she received a injection in 2018

## 2020-04-05 NOTE — ED Provider Notes (Signed)
[facility]    CSN: [id number] Arrival date & time: 04/05/20  1558      History   Chief Complaint Chief Complaint  Patient presents with  . Laceration    right hand     HPI [name] is a 54 y.o. female.   HPI  Patient presents today with a laceration to the right third digit along with a injury to her fifth digit resulting in glass being stuck in her finger.  Patient was dumpster diving without gloves today and made contact with broken glass.  Injury happened approximately 2 hours ago.  She has been able to control bleeding with a pressure dressing.  Patient takes an aspirin otherwise is not on any other blood thinners.  Patient maintains good sensation denies any numbness or diminished range of motion in the affected digits.  Past Medical History:  Diagnosis Date  . ADD (attention deficit disorder)   . Depression   . History of gastric bypass   . Neuropathy   . Spinal cord stimulator status   . TIA (transient ischemic attack)     Patient Active Problem List   Diagnosis Date Noted  . Colitis 02/26/2018  . Spinal cord stimulator status   . UNSPECIFIED TACHYCARDIA 01/14/2010  . SHORTNESS OF BREATH 01/14/2010  . PAROXYSMAL SUPRAVENTRICULAR TACHYCARDIA 12/22/2009    Past Surgical History:  Procedure Laterality Date  . ABDOMINAL HYSTERECTOMY    . ANKLE SURGERY Left   . BACK SURGERY    . CHOLECYSTECTOMY    . EYE SURGERY    . GASTRIC BYPASS    . TONSILLECTOMY      OB History    Gravida  2   Para  2   Term      Preterm      AB      Living  2     SAB      TAB      Ectopic      Multiple      Live Births               Home Medications    Prior to Admission medications   Medication Sig Start Date End Date Taking? Authorizing Provider  amphetamine-dextroamphetamine (ADDERALL) 20 MG tablet Take 20 mg by mouth 3 (three) times daily.    [provider]  ARIPiprazole (ABILIFY) 5 MG tablet Take 5 mg by mouth 2 (two) times  daily.     [provider]  aspirin EC 81 MG tablet Take 81 mg by mouth daily.    [provider]  buPROPion (WELLBUTRIN XL) 150 MG 24 hr tablet Take 150 mg by mouth daily.    [provider]  DULoxetine (CYMBALTA) 60 MG capsule Take 60 mg by mouth daily.    [provider]  fentaNYL (DURAGESIC - DOSED MCG/HR) 50 MCG/HR Place 1 patch onto the skin every other day.     [provider]  gabapentin (NEURONTIN) 800 MG tablet Take 800 mg by mouth 4 (four) times daily.     [provider]  ibuprofen (ADVIL,MOTRIN) 200 MG tablet Take 400-600 mg by mouth every 6 (six) hours as needed for pain.    [provider]  levothyroxine (SYNTHROID, LEVOTHROID) 25 MCG tablet Take 25 mcg by mouth daily before breakfast.    [provider]  lidocaine (LIDODERM) 5 % Place 1 patch onto the skin daily as needed (pain). Remove & Discard patch within 12 hours or as directed  by MD    [provider]  nortriptyline (PAMELOR) 75 MG capsule Take 150 mg by mouth at bedtime.    [provider]  oxyCODONE-acetaminophen (PERCOCET) 10-325 MG tablet Take 1 tablet by mouth 2 (two) times daily.    [provider]    Family History Family History  Problem Relation Age of Onset  . Cancer Mother        MELANOMA  . Cancer Father        THYROID  . Hypertension Father   . Diabetes Maternal Aunt   . Heart disease Maternal Aunt   . Diabetes Paternal Uncle   . Cancer Maternal Grandmother        LUNG- SMOKER  . Cancer Maternal Grandfather        BONE   . Cancer Paternal Grandmother        SKIN- BASAL CELL    Social History Social History   Tobacco Use  . Smoking status: Never Smoker  . Smokeless tobacco: Never Used  Substance Use Topics  . Alcohol use: Yes    Alcohol/week: 0.0 standard drinks    Comment: OCC  . Drug use: No     Allergies   Lactose intolerance (gi), Other, Apple fruit extract, Iodine, and Shellfish  allergy  Review of Systems Review of Systems Pertinent negatives listed in HPI  Physical Exam Triage Vital Signs ED Triage Vitals  Enc Vitals Group     BP 04/05/20 1619 127/62     Pulse Rate 04/05/20 1619 79     Resp 04/05/20 1619 16     Temp 04/05/20 1619 98.6 F (37 C)     Temp Source 04/05/20 1619 Oral     SpO2 04/05/20 1619 97 %     Weight --      Height --      Head Circumference --      Peak Flow --      Pain Score 04/05/20 1617 0     Pain Loc --      Pain Edu? --      Excl. in GC? --    No data found.  Updated Vital Signs BP 127/62 (BP Location: Right Arm)   Pulse 79   Temp 98.6 F (37 C) (Oral)   Resp 16   SpO2 97%   Visual Acuity Right Eye Distance:   Left Eye Distance:   Bilateral Distance:    Right Eye Near:   Left Eye Near:    Bilateral Near:     Physical Exam General appearance: alert, well developed, well nourished, cooperative and in no distress Head: Normocephalic, without obvious abnormality, atraumatic Respiratory: Respirations even and unlabored, normal respiratory rate Heart: rate and rhythm normal. No gallop or murmurs noted on exam  Extremities: Laceration right distal middle finger and injury 5th finger visible glass embedded in finger  Skin: Skin color, texture, turgor normal. No rashes seen  Psych: Appropriate mood and affect. UC Treatments / Results  Labs (all labs ordered are listed, but only abnormal results are displayed) Labs Reviewed - No data to display  EKG   Radiology No results found.  Procedures Laceration Repair  Date/Time: 04/05/2020 5:00 PM Performed by: Bing NeighborsHarris, Jolan Upchurch S, FNP Authorized by: Bing NeighborsHarris, Falyn Rubel S, FNP   Anesthesia (see MAR for exact dosages):    Anesthesia method:  Local infiltration   Local anesthetic:  Lidocaine 2% w/o epi Laceration details:    Location:  Finger   Finger location:  R long finger  Repair type:    Repair type:  Simple Pre-procedure details:    Preparation:  Patient  was prepped and draped in usual sterile fashion Exploration:    Wound exploration: wound explored through full range of motion     Contaminated: no   Treatment:    Area cleansed with:  Hibiclens   Amount of cleaning:  Standard   Irrigation solution:  Sterile saline   Irrigation method:  Syringe   Visualized foreign bodies/material removed: no   Skin repair:    Repair method:  Sutures   Suture size:  3-0   Suture material:  Prolene   Suture technique:  Simple interrupted   Number of sutures:  3 Approximation:    Approximation:  Close Post-procedure details:    Dressing:  Bulky dressing   Patient tolerance of procedure:  Tolerated well, no immediate complications Foreign Body Removal  Date/Time: 04/05/2020 5:25 PM Performed by: Bing Neighbors, FNP Authorized by: Bing Neighbors, FNP   Consent:    Consent obtained:  Verbal   Consent given by:  Patient   Risks discussed:  Bleeding   Alternatives discussed:  No treatment Location:    Location:  Finger   Finger location:  R little finger   Depth:  Intradermal   Tendon involvement:  Complex Pre-procedure details:    Imaging:  None   Neurovascular status: intact   Anesthesia (see MAR for exact dosages):    Anesthesia method:  Local infiltration   Local anesthetic:  Lidocaine 2% w/o epi Procedure type:    Procedure complexity:  Complex Procedure details:    Scalpel size:  11   Incision length:  2   Localization method:  Probed   Dissection of underlying tissues: no     Bloodless field: no     Removal mechanism:  Forceps   Foreign bodies recovered:  2   Intact foreign body removal: yes   Post-procedure details:    Confirmation:  No additional foreign bodies on visualization   Skin closure:  Sutures   Number of sutures:  1   Suture size:  3-0   Suture material:  Prolene   Suture technique:  Simple interrupted   Patient tolerance of procedure:  Tolerated well, no immediate complications   (including critical  care time)  Medications Ordered in UC Medications - No data to display  Initial Impression / Assessment and Plan / UC Course  I have reviewed the triage vital signs and the nursing notes.  Pertinent labs & imaging results that were available during my care of the patient were reviewed by me and considered in my medical decision making (see chart for details).    Laceration repair to the right third middle finger tolerated without complications.  Patient advised to return to have 3 sutures removed from the right third middle digit in 7 days.  Advised to monitor for signs of infection.  Secondly patient had 3 pieces of glass removed from fifth right digit.  Glass particles were intact.  Small surgical incision made to remove glass.  Closed incision with 1 suturing material.  Advised that should also be removed in 7 days.  Patient is up-to-date on her tetanus vaccine.  Advised to monitor for any signs of infection if occurs return sooner than recommended suture removal date.  Patient verbalized understanding and agreement with plan. Final Clinical Impressions(s) / UC Diagnoses   Final diagnoses:  Laceration of left middle finger without foreign body without damage to nail, initial encounter  Laceration of left little finger with foreign body without damage to nail, initial encounter     Discharge Instructions     Change dressing at least once or twice daily.  May leave open to air once bleeding is controlled and while at home. Return in 7 days for suture removal.    ED Prescriptions    None     PDMP not reviewed this encounter.   Bing Neighbors, [place] 04/06/20 [phone]

## 2020-04-05 NOTE — Discharge Instructions (Signed)
Change dressing at least once or twice daily.  May leave open to air once bleeding is controlled and while at home. Return in 7 days for suture removal.

## 2020-04-07 ENCOUNTER — Other Ambulatory Visit: Payer: Self-pay

## 2020-04-07 ENCOUNTER — Other Ambulatory Visit: Payer: Medicare HMO

## 2020-04-07 DIAGNOSIS — Z20822 Contact with and (suspected) exposure to covid-19: Secondary | ICD-10-CM

## 2020-04-08 LAB — SARS-COV-2, NAA 2 DAY TAT

## 2020-04-08 LAB — NOVEL CORONAVIRUS, NAA: SARS-CoV-2, NAA: NOT DETECTED

## 2020-04-10 ENCOUNTER — Other Ambulatory Visit: Payer: Self-pay

## 2020-04-10 ENCOUNTER — Other Ambulatory Visit: Payer: Medicare HMO

## 2020-04-10 DIAGNOSIS — Z20822 Contact with and (suspected) exposure to covid-19: Secondary | ICD-10-CM

## 2020-04-11 LAB — NOVEL CORONAVIRUS, NAA: SARS-CoV-2, NAA: DETECTED — AB

## 2020-04-11 LAB — SARS-COV-2, NAA 2 DAY TAT

## 2020-04-12 ENCOUNTER — Other Ambulatory Visit (HOSPITAL_COMMUNITY): Payer: Self-pay | Admitting: Family

## 2020-04-12 ENCOUNTER — Ambulatory Visit (HOSPITAL_COMMUNITY)
Admission: RE | Admit: 2020-04-12 | Discharge: 2020-04-12 | Disposition: A | Discharge status: Home / Self Care | Payer: Medicare HMO | Source: Ambulatory Visit | Attending: Pulmonary Disease | Admitting: Pulmonary Disease

## 2020-04-12 DIAGNOSIS — Z23 Encounter for immunization: Secondary | ICD-10-CM | POA: Diagnosis not present

## 2020-04-12 DIAGNOSIS — U071 COVID-19: Secondary | ICD-10-CM

## 2020-04-12 DIAGNOSIS — F89 Unspecified disorder of psychological development: Secondary | ICD-10-CM | POA: Insufficient documentation

## 2020-04-12 MED ORDER — DIPHENHYDRAMINE HCL 50 MG/ML IJ SOLN: 50.0000 mg | Freq: Once | INTRAMUSCULAR | Status: DC | PRN

## 2020-04-12 MED ORDER — METHYLPREDNISOLONE SODIUM SUCC 125 MG IJ SOLR: 125.0000 mg | Freq: Once | INTRAMUSCULAR | Status: DC | PRN

## 2020-04-12 MED ORDER — EPINEPHRINE 0.3 MG/0.3ML IJ SOAJ: 0.3000 mg | Freq: Once | INTRAMUSCULAR | Status: DC | PRN

## 2020-04-12 MED ORDER — ALBUTEROL SULFATE HFA 108 (90 BASE) MCG/ACT IN AERS: 2.0000 | INHALATION_SPRAY | Freq: Once | RESPIRATORY_TRACT | Status: DC | PRN

## 2020-04-12 MED ORDER — FAMOTIDINE IN NACL 20-0.9 MG/50ML-% IV SOLN: 20.0000 mg | Freq: Once | INTRAVENOUS | Status: DC | PRN

## 2020-04-12 MED ORDER — SODIUM CHLORIDE 0.9 % IV SOLN: INTRAVENOUS | Status: DC | PRN

## 2020-04-12 MED ORDER — SOTROVIMAB 500 MG/8ML IV SOLN
500.0000 mg | Freq: Once | INTRAVENOUS | Status: AC
  Administered 2020-04-12: 500 mg via INTRAVENOUS

## 2020-04-12 NOTE — Progress Notes (Signed)
  Diagnosis: COVID-19  Physician: Dr. Patrick Wright  Procedure: Covid Infusion Clinic Med: Sotrovimab - Provided patient with sotrovimab fact sheet for patients, parents, and caregivers prior to infusion.   Complications: No immediate complications noted.  Discharge: Discharged home   [name] 04/12/2020  

## 2020-04-12 NOTE — Discharge Instructions (Signed)

## 2020-04-12 NOTE — Progress Notes (Signed)
I connected by phone with [name] on 04/12/2020 at 1:53 PM to discuss the potential use of a new treatment for mild to moderate COVID-19 viral infection in non-hospitalized patients.  This patient is a 54 y.o. female that meets the FDA criteria for Emergency Use Authorization of COVID monoclonal antibody casirivimab/imdevimab, bamlanivimab/eteseviamb, or sotrovimab.  Has a (+) direct SARS-CoV-2 viral test result  Has mild or moderate COVID-19   Is NOT hospitalized due to COVID-19  Is within 10 days of symptom onset  Has at least one of the high risk factor(s) for progression to severe COVID-19 and/or hospitalization as defined in EUA.  Specific high risk criteria : Neurodevelopmental disorder   Symptoms of H/A, cough, fatigue began 04/09/20.   I have spoken and communicated the following to the patient or parent/caregiver regarding COVID monoclonal antibody treatment:  1. FDA has authorized the emergency use for the treatment of mild to moderate COVID-19 in adults and pediatric patients with positive results of direct SARS-CoV-2 viral testing who are 52 years of age and older weighing at least 40 kg, and who are at high risk for progressing to severe COVID-19 and/or hospitalization.  2. The significant known and potential risks and benefits of COVID monoclonal antibody, and the extent to which such potential risks and benefits are unknown.  3. Information on available alternative treatments and the risks and benefits of those alternatives, including clinical trials.  4. Patients treated with COVID monoclonal antibody should continue to self-isolate and use infection control measures (e.g., wear mask, isolate, social distance, avoid sharing personal items, clean and disinfect "high touch" surfaces, and frequent handwashing) according to CDC guidelines.   5. The patient or parent/caregiver has the option to accept or refuse COVID monoclonal antibody treatment.  After reviewing this  information with the patient, the patient has agreed to receive one of the available covid 19 monoclonal antibodies and will be provided an appropriate fact sheet prior to infusion. Morton Stall, NP 04/12/2020 1:53 PM

## 2020-07-28 ENCOUNTER — Other Ambulatory Visit: Payer: Self-pay | Admitting: Gastroenterology

## 2020-07-28 ENCOUNTER — Other Ambulatory Visit (HOSPITAL_COMMUNITY): Payer: Self-pay | Admitting: Gastroenterology

## 2020-07-28 DIAGNOSIS — R1033 Periumbilical pain: Secondary | ICD-10-CM

## 2020-08-04 ENCOUNTER — Ambulatory Visit (HOSPITAL_COMMUNITY)
Admission: RE | Admit: 2020-08-04 | Discharge: 2020-08-04 | Disposition: A | Discharge status: Home / Self Care | Payer: Medicare HMO | Source: Ambulatory Visit | Attending: Gastroenterology | Admitting: Gastroenterology

## 2020-08-04 ENCOUNTER — Encounter (HOSPITAL_COMMUNITY): Payer: Self-pay

## 2020-08-04 ENCOUNTER — Other Ambulatory Visit: Payer: Self-pay

## 2020-08-04 DIAGNOSIS — R1033 Periumbilical pain: Secondary | ICD-10-CM

## 2020-08-04 MED ORDER — IOHEXOL 300 MG/ML  SOLN
100.0000 mL | Freq: Once | INTRAMUSCULAR | Status: AC | PRN
  Administered 2020-08-04: 100 mL via INTRAVENOUS

## 2020-08-04 MED ORDER — IOHEXOL 350 MG/ML SOLN: 100.0000 mL | Freq: Once | INTRAVENOUS | Status: DC | PRN

## 2021-01-08 ENCOUNTER — Other Ambulatory Visit: Payer: Self-pay

## 2021-01-08 ENCOUNTER — Other Ambulatory Visit: Payer: Self-pay | Admitting: Obstetrics and Gynecology

## 2021-01-08 DIAGNOSIS — R928 Other abnormal and inconclusive findings on diagnostic imaging of breast: Secondary | ICD-10-CM

## 2021-01-26 ENCOUNTER — Ambulatory Visit
Admission: RE | Admit: 2021-01-26 | Discharge: 2021-01-26 | Disposition: A | Discharge status: Home / Self Care | Payer: Medicare HMO | Source: Ambulatory Visit | Attending: Obstetrics and Gynecology | Admitting: Obstetrics and Gynecology

## 2021-01-26 ENCOUNTER — Ambulatory Visit: Payer: Medicare HMO

## 2021-01-26 ENCOUNTER — Other Ambulatory Visit: Payer: Self-pay

## 2021-01-26 DIAGNOSIS — R928 Other abnormal and inconclusive findings on diagnostic imaging of breast: Secondary | ICD-10-CM

## 2021-04-22 ENCOUNTER — Other Ambulatory Visit: Payer: Self-pay

## 2021-04-22 ENCOUNTER — Ambulatory Visit
Admission: RE | Admit: 2021-04-22 | Discharge: 2021-04-22 | Disposition: A | Discharge status: Home / Self Care | Payer: Medicare HMO | Source: Ambulatory Visit | Attending: Physician Assistant | Admitting: Physician Assistant

## 2021-04-22 ENCOUNTER — Other Ambulatory Visit: Payer: Self-pay | Admitting: Physician Assistant

## 2021-04-22 DIAGNOSIS — M542 Cervicalgia: Secondary | ICD-10-CM

## 2021-04-22 DIAGNOSIS — R52 Pain, unspecified: Secondary | ICD-10-CM

## 2021-08-20 ENCOUNTER — Other Ambulatory Visit: Payer: Self-pay | Admitting: Nurse Practitioner

## 2021-09-18 ENCOUNTER — Ambulatory Visit
Admission: RE | Admit: 2021-09-18 | Discharge: 2021-09-18 | Disposition: A | Discharge status: Home / Self Care | Payer: Medicare HMO | Source: Ambulatory Visit | Attending: Nurse Practitioner | Admitting: Nurse Practitioner

## 2021-09-18 DIAGNOSIS — M5412 Radiculopathy, cervical region: Secondary | ICD-10-CM

## 2021-11-03 ENCOUNTER — Other Ambulatory Visit: Payer: Self-pay | Admitting: Nurse Practitioner

## 2021-11-03 DIAGNOSIS — M5416 Radiculopathy, lumbar region: Secondary | ICD-10-CM

## 2021-11-24 ENCOUNTER — Other Ambulatory Visit: Payer: Self-pay | Admitting: Physician Assistant

## 2021-11-24 ENCOUNTER — Other Ambulatory Visit (HOSPITAL_BASED_OUTPATIENT_CLINIC_OR_DEPARTMENT_OTHER): Payer: Self-pay | Admitting: Physician Assistant

## 2021-11-24 DIAGNOSIS — M79641 Pain in right hand: Secondary | ICD-10-CM

## 2021-11-26 ENCOUNTER — Other Ambulatory Visit: Payer: Medicare HMO

## 2021-12-01 ENCOUNTER — Other Ambulatory Visit: Payer: Self-pay | Admitting: Physician Assistant

## 2021-12-01 DIAGNOSIS — M79641 Pain in right hand: Secondary | ICD-10-CM

## 2021-12-09 ENCOUNTER — Other Ambulatory Visit: Payer: Self-pay | Admitting: Orthopedic Surgery

## 2021-12-09 ENCOUNTER — Other Ambulatory Visit: Payer: Medicare HMO

## 2022-01-05 ENCOUNTER — Other Ambulatory Visit (HOSPITAL_COMMUNITY): Payer: Self-pay | Admitting: Physician Assistant

## 2022-01-05 DIAGNOSIS — M79641 Pain in right hand: Secondary | ICD-10-CM

## 2022-02-03 ENCOUNTER — Ambulatory Visit (HOSPITAL_COMMUNITY): Payer: Medicare HMO | Attending: Physician Assistant

## 2022-02-03 ENCOUNTER — Encounter (HOSPITAL_COMMUNITY): Payer: Self-pay

## 2022-02-10 ENCOUNTER — Inpatient Hospital Stay (HOSPITAL_COMMUNITY): Admission: RE | Admit: 2022-02-10 | Payer: Medicare HMO | Source: Ambulatory Visit

## 2022-02-17 ENCOUNTER — Ambulatory Visit (HOSPITAL_COMMUNITY): Admission: RE | Admit: 2022-02-17 | Payer: Medicare HMO | Source: Ambulatory Visit | Admitting: Orthopedic Surgery

## 2022-02-17 ENCOUNTER — Encounter (HOSPITAL_COMMUNITY): Admission: RE | Payer: Self-pay | Source: Ambulatory Visit

## 2022-02-17 SURGERY — ANTERIOR CERVICAL DECOMPRESSION/DISCECTOMY FUSION 2 LEVELS
Anesthesia: General

## 2022-04-14 ENCOUNTER — Other Ambulatory Visit: Payer: Self-pay | Admitting: Physician Assistant

## 2022-04-14 DIAGNOSIS — M79641 Pain in right hand: Secondary | ICD-10-CM

## 2022-05-13 ENCOUNTER — Other Ambulatory Visit: Payer: Medicare HMO

## 2022-05-14 ENCOUNTER — Ambulatory Visit
Admission: RE | Admit: 2022-05-14 | Discharge: 2022-05-14 | Disposition: A | Discharge status: Home / Self Care | Payer: Medicare HMO | Source: Ambulatory Visit | Attending: Physician Assistant | Admitting: Physician Assistant

## 2022-05-14 DIAGNOSIS — M79641 Pain in right hand: Secondary | ICD-10-CM

## 2022-05-20 ENCOUNTER — Ambulatory Visit
Admission: RE | Admit: 2022-05-20 | Discharge: 2022-05-20 | Disposition: A | Discharge status: Home / Self Care | Payer: Medicare HMO | Source: Ambulatory Visit | Attending: Physician Assistant | Admitting: Physician Assistant

## 2022-05-29 ENCOUNTER — Ambulatory Visit
Admission: EM | Admit: 2022-05-29 | Discharge: 2022-05-29 | Disposition: A | Discharge status: Home / Self Care | Payer: Medicare HMO | Attending: Urgent Care | Admitting: Urgent Care

## 2022-05-29 DIAGNOSIS — Z9884 Bariatric surgery status: Secondary | ICD-10-CM

## 2022-05-29 DIAGNOSIS — J309 Allergic rhinitis, unspecified: Secondary | ICD-10-CM

## 2022-05-29 DIAGNOSIS — U071 COVID-19: Secondary | ICD-10-CM | POA: Insufficient documentation

## 2022-05-29 DIAGNOSIS — G629 Polyneuropathy, unspecified: Secondary | ICD-10-CM

## 2022-05-29 DIAGNOSIS — G894 Chronic pain syndrome: Secondary | ICD-10-CM

## 2022-05-29 DIAGNOSIS — B349 Viral infection, unspecified: Secondary | ICD-10-CM

## 2022-05-29 MED ORDER — FAMOTIDINE 20 MG PO TABS: 20.0000 mg | ORAL_TABLET | Freq: Two times a day (BID) | ORAL | 0 refills | Status: DC

## 2022-05-29 MED ORDER — CETIRIZINE HCL 10 MG PO TABS: 10.0000 mg | ORAL_TABLET | Freq: Every day | ORAL | 0 refills | Status: DC

## 2022-05-29 MED ORDER — PAXLOVID (300/100) 20 X 150 MG & 10 X 100MG PO TBPK: ORAL_TABLET | ORAL | 0 refills | Status: DC

## 2022-05-29 MED ORDER — PREDNISONE 10 MG PO TABS: 30.0000 mg | ORAL_TABLET | Freq: Every day | ORAL | 0 refills | Status: DC

## 2022-05-29 NOTE — Discharge Instructions (Addendum)
Take half of the oxycodone while on Paxlovid.

## 2022-05-29 NOTE — ED Provider Notes (Signed)
[facility]  Note:  This document was prepared using Conservation officer, historic buildings and may include unintentional dictation errors.  MRN: [id number] DOB: 1966-03-21  Subjective:   [name] is a 56 y.o. female presenting for 1 day history of acute onset sinus pressure, significant sinus congestion and drainage, bilateral ear fullness and sinus headache.  Has had a mild cough. Had a positive COVID test at home but was expired test.  No chest pain, shortness of breath or wheezing.  Patient has typically done well with prednisone and would like to undergo a course.  No current facility-administered medications for this encounter.  Current Outpatient Medications:    methocarbamol (ROBAXIN) 500 MG tablet, Take 500 mg by mouth 4 (four) times daily., Disp: , Rfl:    amphetamine-dextroamphetamine (ADDERALL) 20 MG tablet, Take 20 mg by mouth 3 (three) times daily., Disp: , Rfl:    ARIPiprazole (ABILIFY) 5 MG tablet, Take 5 mg by mouth 2 (two) times daily. , Disp: , Rfl:    aspirin EC 81 MG tablet, Take 81 mg by mouth daily., Disp: , Rfl:    buPROPion (WELLBUTRIN XL) 150 MG 24 hr tablet, Take 150 mg by mouth daily., Disp: , Rfl:    DULoxetine (CYMBALTA) 60 MG capsule, Take 60 mg by mouth daily., Disp: , Rfl:    fentaNYL (DURAGESIC - DOSED MCG/HR) 50 MCG/HR, Place 1 patch onto the skin every other day. , Disp: , Rfl:    gabapentin (NEURONTIN) 800 MG tablet, Take 800 mg by mouth 4 (four) times daily. , Disp: , Rfl:    ibuprofen (ADVIL,MOTRIN) 200 MG tablet, Take 400-600 mg by mouth every 6 (six) hours as needed for pain., Disp: , Rfl:    Levorphanol Tartrate 3 MG TABS, 1 tab(s) orally every 8 hours, Disp: , Rfl:    levothyroxine (SYNTHROID, LEVOTHROID) 25 MCG tablet, Take 25 mcg by mouth daily before breakfast., Disp: , Rfl:    lidocaine (LIDODERM) 5 %, Place 1 patch onto the skin daily as needed (pain). Remove & Discard patch within 12 hours or as directed by MD, Disp: ,  Rfl:    nortriptyline (PAMELOR) 75 MG capsule, Take 150 mg by mouth at bedtime., Disp: , Rfl:    oxyCODONE-acetaminophen (PERCOCET) 10-325 MG tablet, Take 1 tablet by mouth 2 (two) times daily., Disp: , Rfl:    Allergies  Allergen Reactions   Other Shortness Of Breath    Egg plant   Apple Fruit Extract Nausea And Vomiting   Iodine Hives   Shellfish Allergy Hives    Past Medical History:  Diagnosis Date   ADD (attention deficit disorder)    Depression    History of gastric bypass    Neuropathy    Spinal cord stimulator status    TIA (transient ischemic attack)      Past Surgical History:  Procedure Laterality Date   ABDOMINAL HYSTERECTOMY     ANKLE SURGERY Left    BACK SURGERY     CHOLECYSTECTOMY     EYE SURGERY     GASTRIC BYPASS     TONSILLECTOMY      Family History  Problem Relation Age of Onset   Cancer Mother        MELANOMA   Cancer Father        THYROID   Hypertension Father    Diabetes Maternal Aunt    Heart disease Maternal Aunt    Diabetes Paternal Uncle    Cancer Maternal  Grandmother        LUNG- SMOKER   Cancer Maternal Grandfather        BONE    Cancer Paternal Grandmother        SKIN- BASAL CELL    Social History   Tobacco Use   Smoking status: Never   Smokeless tobacco: Never  Substance Use Topics   Alcohol use: Yes    Alcohol/week: 0.0 standard drinks of alcohol    Comment: OCC   Drug use: No    ROS   Objective:   Vitals: BP (!) 147/85 (BP Location: Right Arm)   Pulse 84   Temp 98.1 F (36.7 C) (Oral)   Resp 18   SpO2 98%   Physical Exam Constitutional:      General: She is not in acute distress.    Appearance: Normal appearance. She is well-developed and normal weight. She is not ill-appearing, toxic-appearing or diaphoretic.  HENT:     Head: Normocephalic and atraumatic.     Right Ear: Tympanic membrane, ear canal and external ear normal. No drainage or tenderness. No middle ear effusion. There is no impacted cerumen.  Tympanic membrane is not erythematous or bulging.     Left Ear: Tympanic membrane, ear canal and external ear normal. No drainage or tenderness.  No middle ear effusion. There is no impacted cerumen. Tympanic membrane is not erythematous or bulging.     Nose: Congestion present. No rhinorrhea.     Mouth/Throat:     Mouth: Mucous membranes are moist. No oral lesions.     Pharynx: No pharyngeal swelling, oropharyngeal exudate, posterior oropharyngeal erythema or uvula swelling.     Tonsils: No tonsillar exudate or tonsillar abscesses.     Comments: Significant post-nasal drainage.  Eyes:     General: No scleral icterus.       Right eye: No discharge.        Left eye: No discharge.     Extraocular Movements: Extraocular movements intact.     Right eye: Normal extraocular motion.     Left eye: Normal extraocular motion.     Conjunctiva/sclera: Conjunctivae normal.  Cardiovascular:     Rate and Rhythm: Normal rate and regular rhythm.     Heart sounds: Normal heart sounds. No murmur heard.    No friction rub. No gallop.  Pulmonary:     Effort: Pulmonary effort is normal. No respiratory distress.     Breath sounds: No stridor. No wheezing, rhonchi or rales.  Chest:     Chest wall: No tenderness.  Musculoskeletal:     Cervical back: Normal range of motion and neck supple.  Lymphadenopathy:     Cervical: No cervical adenopathy.  Skin:    General: Skin is warm and dry.  Neurological:     General: No focal deficit present.     Mental Status: She is alert and oriented to person, place, and time.  Psychiatric:        Mood and Affect: Mood normal.        Behavior: Behavior normal.     Assessment and Plan :   PDMP not reviewed this encounter.  1. Acute viral syndrome   2. Allergic rhinitis, unspecified seasonality, unspecified trigger   3. History of gastric bypass   4. Chronic pain syndrome   5. Neuropathy     Despite the expired test the result was positive, patient is  symptomatic and therefore I offered her Paxlovid.  I was agreeable to using prednisone with her  as she has done well with this in the past.  Use supportive care otherwise for an acute viral syndrome. Deferred imaging given clear cardiopulmonary exam, hemodynamically stable vital signs.  COVID testing is pending. Counseled patient on potential for adverse effects with medications prescribed/recommended today, ER and return-to-clinic precautions discussed, patient verbalized understanding.    Wallis Bamberg, [place] 05/30/22 [phone]

## 2022-05-29 NOTE — ED Triage Notes (Signed)
Pt reports sinus pressure, headache, nasal drainage, clogged ears, cough since this morning. Took a covid test and it was negative. Took mucinex, excedrin and sudafed which gave some relief.

## 2022-05-31 LAB — SARS CORONAVIRUS 2 (TAT 6-24 HRS): SARS Coronavirus 2: POSITIVE — AB

## 2022-09-11 ENCOUNTER — Emergency Department (HOSPITAL_COMMUNITY)
Admission: EM | Admit: 2022-09-11 | Discharge: 2022-09-11 | Disposition: A | Discharge status: Home / Self Care | Payer: Medicare HMO | Attending: Emergency Medicine | Admitting: Emergency Medicine

## 2022-09-11 ENCOUNTER — Encounter (HOSPITAL_COMMUNITY): Payer: Self-pay

## 2022-09-11 DIAGNOSIS — Z7982 Long term (current) use of aspirin: Secondary | ICD-10-CM | POA: Insufficient documentation

## 2022-09-11 DIAGNOSIS — G8918 Other acute postprocedural pain: Secondary | ICD-10-CM

## 2022-09-11 DIAGNOSIS — M25531 Pain in right wrist: Secondary | ICD-10-CM | POA: Insufficient documentation

## 2022-09-11 LAB — COMPREHENSIVE METABOLIC PANEL
ALT: 11 U/L (ref 0–44)
AST: 21 U/L (ref 15–41)
Albumin: 3.7 g/dL (ref 3.5–5.0)
Alkaline Phosphatase: 117 U/L (ref 38–126)
Anion gap: 9 (ref 5–15)
BUN: 14 mg/dL (ref 6–20)
CO2: 28 mmol/L (ref 22–32)
Calcium: 8.8 mg/dL — ABNORMAL LOW (ref 8.9–10.3)
Chloride: 99 mmol/L (ref 98–111)
Creatinine, Ser: 0.9 mg/dL (ref 0.44–1.00)
GFR, Estimated: >60 mL/min (ref >60)
Glucose, Bld: 94 mg/dL (ref 70–99)
Potassium: 4.2 mmol/L (ref 3.5–5.1)
Sodium: 136 mmol/L (ref 135–145)
Total Bilirubin: 0.4 mg/dL (ref 0.3–1.2)
Total Protein: 6.4 g/dL — ABNORMAL LOW (ref 6.5–8.1)

## 2022-09-11 LAB — CBC
HCT: 36.1 % (ref 36.0–46.0)
Hemoglobin: 11.3 g/dL — ABNORMAL LOW (ref 12.0–15.0)
MCH: 29.4 pg (ref 26.0–34.0)
MCHC: 31.3 g/dL (ref 30.0–36.0)
MCV: 94 fL (ref 80.0–100.0)
Platelets: 358 10*3/uL (ref 150–400)
RBC: 3.84 MIL/uL — ABNORMAL LOW (ref 3.87–5.11)
RDW: 12.6 % (ref 11.5–15.5)
WBC: 6.1 10*3/uL (ref 4.0–10.5)
nRBC: 0 % (ref 0.0–0.2)

## 2022-09-11 NOTE — Progress Notes (Signed)
Orthopedic Tech Progress Note Patient Details:  [name] [birth year] [id number]  Ortho Devices Type of Ortho Device: Thumb spica splint Splint Material: Fiberglass Ortho Device/Splint Location: Right hand/thumb Ortho Device/Splint Interventions: Removal      Jovante Hammitt E Rhiley Tarver 09/11/2022, 4:21 PM

## 2022-09-11 NOTE — ED Provider Notes (Signed)
[place] EMERGENCY DEPARTMENT AT [facility] Provider Note   CSN: [id number] Arrival date & time: 09/11/22  1358     History  Chief Complaint  Patient presents with   Post-op Problem    [name] is a 57 y.o. female.  HPI Patient presents with pain in her right wrist.  Patient had removal of excess bone due to degenerative disease.  This was performed last month.  She was recovering well until the past day when developed severe pain in the wrist.  No loss of sensation distally, no proximal complaints, no fevers, chills, nausea, vomiting Today she went to Ortho walk-in clinic and was sent here for evaluation.     Home Medications Prior to Admission medications   Medication Sig Start Date End Date Taking? Authorizing Provider  amphetamine-dextroamphetamine (ADDERALL) 20 MG tablet Take 20 mg by mouth 3 (three) times daily.    [provider]  ARIPiprazole (ABILIFY) 5 MG tablet Take 5 mg by mouth 2 (two) times daily.     [provider]  aspirin EC 81 MG tablet Take 81 mg by mouth daily.    [provider]  buPROPion (WELLBUTRIN XL) 150 MG 24 hr tablet Take 150 mg by mouth daily.    [provider]  cetirizine (ZYRTEC ALLERGY) 10 MG tablet Take 1 tablet (10 mg total) by mouth daily. 05/29/22   Wallis Bamberg, PA-C  DULoxetine (CYMBALTA) 60 MG capsule Take 60 mg by mouth daily.    [provider]  famotidine (PEPCID) 20 MG tablet Take 1 tablet (20 mg total) by mouth 2 (two) times daily. 05/29/22   Wallis Bamberg, PA-C  gabapentin (NEURONTIN) 800 MG tablet Take 800 mg by mouth 4 (four) times daily.     [provider]  ibuprofen (ADVIL,MOTRIN) 200 MG tablet Take 400-600 mg by mouth every 6 (six) hours as needed for pain.    [provider]  Levorphanol Tartrate 3 MG TABS 1 tab(s) orally every 8 hours    [provider]  levothyroxine (SYNTHROID, LEVOTHROID) 25 MCG tablet Take 25 mcg by mouth daily before  breakfast.    [provider]  lidocaine (LIDODERM) 5 % Place 1 patch onto the skin daily as needed (pain). Remove & Discard patch within 12 hours or as directed by MD    [provider]  methocarbamol (ROBAXIN) 500 MG tablet Take 500 mg by mouth 4 (four) times daily.    [provider]  nirmatrelvir & ritonavir (PAXLOVID, 300/100,) 20 x 150 MG & 10 x 100MG  TBPK Take 2 tablets nirmtrelvir and 1 tablet ritonavir twice daily. 05/29/22   Wallis Bamberg, PA-C  nortriptyline (PAMELOR) 75 MG capsule Take 150 mg by mouth at bedtime.    [provider]  oxyCODONE-acetaminophen (PERCOCET) 10-325 MG tablet Take 1 tablet by mouth 2 (two) times daily.    [provider]  predniSONE (DELTASONE) 10 MG tablet Take 3 tablets (30 mg total) by mouth daily with breakfast. 05/29/22   Wallis Bamberg, PA-C      Allergies    Other, Apple fruit extract, Iodine, and Shellfish allergy    Review of Systems   Review of Systems  All other systems reviewed and are negative.   Physical Exam Updated Vital Signs BP (!) 140/83   Pulse 72   Temp 98.6 F (37 C) (Oral)   Resp 18   Ht 5\' 6"  (1.676 m)   Wt 70.3 kg   SpO2 100%  BMI 25.02 kg/m  Physical Exam Vitals and nursing note reviewed.  Constitutional:      General: She is not in acute distress.    Appearance: She is well-developed.  HENT:     Head: Normocephalic and atraumatic.  Eyes:     Conjunctiva/sclera: Conjunctivae normal.  Cardiovascular:     Rate and Rhythm: Normal rate and regular rhythm.  Pulmonary:     Effort: Pulmonary effort is normal. No respiratory distress.     Breath sounds: Normal breath sounds. No stridor.  Abdominal:     General: There is no distension.  Musculoskeletal:     Comments: Patient can flex and extend all digits minimally, though appropriately postsurgical, no wrist deformity, no elbow deformity, no pain in this area currently.  Skin:    General: Skin is warm and dry.        Neurological:     Mental Status: She is alert and oriented to person, place, and time.     Cranial Nerves: No cranial nerve deficit.  Psychiatric:        Mood and Affect: Mood normal.     ED Results / Procedures / Treatments   Labs (all labs ordered are listed, but only abnormal results are displayed) Labs Reviewed  CBC - Abnormal; Notable for the following components:      Result Value   RBC 3.84 (*)    Hemoglobin 11.3 (*)    All other components within normal limits  COMPREHENSIVE METABOLIC PANEL - Abnormal; Notable for the following components:   Calcium 8.8 (*)    Total Protein 6.4 (*)    All other components within normal limits    EKG None  Radiology No results found.  Procedures Procedures    Medications Ordered in ED Medications - No data to display  ED Course/ Medical Decision Making/ A&P                             Medical Decision Making Patient presents about 1 week after orthopedic procedure with severe pain in her wrist.  Concern for postop pain versus infection. After the patient had removal of her splint, evaluation was reassuring, no evidence for infection.  Similarly patient had no leukocytosis, no fever, patient comfortable with reapplication of a new splint, follow-up with orthopedics.  Amount and/or Complexity of Data Reviewed Labs: ordered. Decision-making details documented in ED Course.  Risk Prescription drug management. Decision regarding hospitalization.      Final Clinical Impression(s) / ED Diagnoses Final diagnoses:  Post-operative pain    Rx / DC Orders ED Discharge Orders     None         Gerhard Munch, MD 09/11/22 2311

## 2022-09-11 NOTE — Progress Notes (Signed)
Orthopedic Tech Progress Note Patient Details:  [name] 03-Dec-1965 [id number]  Ortho Devices Type of Ortho Device: Thumb spica splint Splint Material: Fiberglass Ortho Device/Splint Location: Right hand/thumb Ortho Device/Splint Interventions: Application   Post Interventions Patient Tolerated: Well  [name] 09/11/2022, 4:52 PM

## 2022-09-11 NOTE — ED Notes (Signed)
Ortho tech at bedside for splint placement. 

## 2022-09-11 NOTE — Discharge Instructions (Signed)
As discussed, your evaluation today has been largely reassuring.  But, it is important that you monitor your condition carefully, and do not hesitate to return to the ED if you develop new, or concerning changes in your condition. ? ?Otherwise, please follow-up with your physician for appropriate ongoing care. ? ?

## 2022-09-11 NOTE — ED Triage Notes (Signed)
Pt has R hand surgery last Monday; this am, hand started throbbing, burning; went to urgent care, sent here for further evaluation; denies fevers; endorses HA x 3 days, took Excedrin around around 1300 this afternoon

## 2023-03-05 ENCOUNTER — Ambulatory Visit (INDEPENDENT_AMBULATORY_CARE_PROVIDER_SITE_OTHER): Payer: Medicare HMO

## 2023-03-05 ENCOUNTER — Ambulatory Visit
Admission: RE | Admit: 2023-03-05 | Discharge: 2023-03-05 | Disposition: A | Discharge status: Home / Self Care | Payer: Medicare HMO | Source: Ambulatory Visit | Attending: Internal Medicine

## 2023-03-05 VITALS — BP 146/91 | HR 71 | Temp 98.5°F | Resp 16

## 2023-03-05 DIAGNOSIS — M545 Low back pain, unspecified: Secondary | ICD-10-CM

## 2023-03-05 DIAGNOSIS — M5136 Other intervertebral disc degeneration, lumbar region: Secondary | ICD-10-CM

## 2023-03-05 DIAGNOSIS — G894 Chronic pain syndrome: Secondary | ICD-10-CM

## 2023-03-05 HISTORY — DX: Essential (primary) hypertension: I10

## 2023-03-05 MED ORDER — OMEPRAZOLE 20 MG PO CPDR: 20.0000 mg | DELAYED_RELEASE_CAPSULE | Freq: Every day | ORAL | 0 refills | Status: AC

## 2023-03-05 MED ORDER — PREDNISONE 20 MG PO TABS: ORAL_TABLET | ORAL | 0 refills | Status: DC

## 2023-03-05 MED ORDER — CYCLOBENZAPRINE HCL 5 MG PO TABS: 5.0000 mg | ORAL_TABLET | Freq: Every evening | ORAL | 0 refills | Status: AC | PRN

## 2023-03-05 MED ORDER — FAMOTIDINE 20 MG PO TABS: 20.0000 mg | ORAL_TABLET | Freq: Two times a day (BID) | ORAL | 0 refills | Status: AC

## 2023-03-05 NOTE — Discharge Instructions (Addendum)
Please go ahead and start an oral prednisone course to help you with your low back pain from your fall.  Given your history of gastric bypass, I recommend taking omeprazole and famotidine to help protect your stomach lining while using prednisone.  You can continue taking your regular pain medications.  Use a muscle relaxant, cyclobenzaprine as needed at bedtime.  This medication together with your chronic pain medications can make you very sleepy, woozy and drowsy.  If he experiences side effects I would just maintain your chronic pain medicines.  I will call you with your x-ray results later or message you through MyChart.

## 2023-03-05 NOTE — ED Provider Notes (Signed)
[facility] - [facility]  Note:  This document was prepared using Conservation officer, historic buildings and may include unintentional dictation errors.  MRN: [id number] DOB: 1966-05-09  Subjective:   [name] is a 57 y.o. female presenting for 1 day history of bilateral low back pain.  Symptoms started after she suffered a fall yesterday in her driveway.  Also reports that she actually did pull her back and strained her back somehow just before the fall.  No numbness or tingling, saddle paresthesia, changes to bowel or urinary habits, radicular symptoms.  Has a history of gastric bypass.  Has previously taken ibuprofen and prednisone.  Has a history of degenerative disc disease, bulging disc.   No current facility-administered medications for this encounter.  Current Outpatient Medications:    amphetamine-dextroamphetamine (ADDERALL) 20 MG tablet, Take 20 mg by mouth 3 (three) times daily., Disp: , Rfl:    aspirin EC 81 MG tablet, Take 81 mg by mouth daily., Disp: , Rfl:    CLONIDINE HCL PO, Take by mouth., Disp: , Rfl:    DULoxetine (CYMBALTA) 60 MG capsule, Take 60 mg by mouth daily., Disp: , Rfl:    gabapentin (NEURONTIN) 800 MG tablet, Take 800 mg by mouth 4 (four) times daily. , Disp: , Rfl:    ibuprofen (ADVIL,MOTRIN) 200 MG tablet, Take 400-600 mg by mouth every 6 (six) hours as needed for pain., Disp: , Rfl:    Levorphanol Tartrate 3 MG TABS, 1 tab(s) orally every 8 hours, Disp: , Rfl:    lidocaine (LIDODERM) 5 %, Place 1 patch onto the skin daily as needed (pain). Remove & Discard patch within 12 hours or as directed by MD, Disp: , Rfl:    methocarbamol (ROBAXIN) 500 MG tablet, Take 500 mg by mouth 4 (four) times daily., Disp: , Rfl:    oxyCODONE-acetaminophen (PERCOCET) 10-325 MG tablet, Take 1 tablet by mouth 2 (two) times daily., Disp: , Rfl:    ARIPiprazole (ABILIFY) 5 MG tablet, Take 5 mg by mouth 2 (two) times daily. , Disp: , Rfl:    buPROPion (WELLBUTRIN XL) 150  MG 24 hr tablet, Take 150 mg by mouth daily., Disp: , Rfl:    cetirizine (ZYRTEC ALLERGY) 10 MG tablet, Take 1 tablet (10 mg total) by mouth daily., Disp: 30 tablet, Rfl: 0   famotidine (PEPCID) 20 MG tablet, Take 1 tablet (20 mg total) by mouth 2 (two) times daily., Disp: 30 tablet, Rfl: 0   HORIZANT 600 MG TBCR, Take 1 tablet by mouth 2 (two) times daily., Disp: , Rfl:    levothyroxine (SYNTHROID, LEVOTHROID) 25 MCG tablet, Take 25 mcg by mouth daily before breakfast., Disp: , Rfl:    nirmatrelvir & ritonavir (PAXLOVID, 300/100,) 20 x 150 MG & 10 x 100MG  TBPK, Take 2 tablets nirmtrelvir and 1 tablet ritonavir twice daily., Disp: 30 tablet, Rfl: 0   nortriptyline (PAMELOR) 75 MG capsule, Take 150 mg by mouth at bedtime., Disp: , Rfl:    predniSONE (DELTASONE) 10 MG tablet, Take 3 tablets (30 mg total) by mouth daily with breakfast., Disp: 15 tablet, Rfl: 0   Allergies  Allergen Reactions   Other Shortness Of Breath    Egg plant   Apple Fruit Extract Nausea And Vomiting   Iodine Hives   Shellfish Allergy Hives    Past Medical History:  Diagnosis Date   ADD (attention deficit disorder)    Depression    History of gastric bypass    Hypertension  Neuropathy    Spinal cord stimulator status    TIA (transient ischemic attack)      Past Surgical History:  Procedure Laterality Date   ABDOMINAL HYSTERECTOMY     ANKLE SURGERY Left    BACK SURGERY     CHOLECYSTECTOMY     EYE SURGERY     GASTRIC BYPASS     HAND SURGERY     TONSILLECTOMY      Family History  Problem Relation Age of Onset   Cancer Mother        MELANOMA   Cancer Father        THYROID   Hypertension Father    Cancer Maternal Grandmother        LUNG- SMOKER   Cancer Maternal Grandfather        BONE    Cancer Paternal Grandmother        SKIN- BASAL CELL   Diabetes Maternal Aunt    Heart disease Maternal Aunt    Diabetes Paternal Uncle     Social History   Tobacco Use   Smoking status: Former    Types:  Cigarettes   Smokeless tobacco: Never  Vaping Use   Vaping status: Never Used  Substance Use Topics   Alcohol use: Not Currently    Comment: rarely   Drug use: No    ROS   Objective:   Vitals: BP (!) 146/91   Pulse 71   Temp 98.5 F (36.9 C) (Oral)   Resp 16   SpO2 97%   Physical Exam Constitutional:      General: She is not in acute distress.    Appearance: Normal appearance. She is well-developed. She is not ill-appearing, toxic-appearing or diaphoretic.  HENT:     Head: Normocephalic and atraumatic.     Nose: Nose normal.     Mouth/Throat:     Mouth: Mucous membranes are moist.  Eyes:     General: No scleral icterus.       Right eye: No discharge.        Left eye: No discharge.     Extraocular Movements: Extraocular movements intact.  Cardiovascular:     Rate and Rhythm: Normal rate.  Pulmonary:     Effort: Pulmonary effort is normal.  Musculoskeletal:     Lumbar back: Spasms and tenderness (bilateral) present. No swelling, edema, deformity, signs of trauma, lacerations or bony tenderness. Normal range of motion. Negative right straight leg raise test and negative left straight leg raise test. No scoliosis.  Skin:    General: Skin is warm and dry.  Neurological:     General: No focal deficit present.     Mental Status: She is alert and oriented to person, place, and time.     Motor: No weakness.     Coordination: Coordination normal.     Gait: Gait normal.     Deep Tendon Reflexes: Reflexes normal.  Psychiatric:        Mood and Affect: Mood normal.        Behavior: Behavior normal.     Assessment and Plan :   I have reviewed the PDMP during this encounter.  1. Acute low back pain without sciatica, unspecified back pain laterality   2. Chronic pain syndrome   3. Degenerative disc disease, lumbar    Will manage conservatively for back strain and low back contusion from her fall with prednisone and muscle relaxant, rest and modification of physical  activity.  Use Prilosec and famotidine for  GI protection given her history of gastric bypass.  Anticipatory guidance provided.  Maintain chronic pain medications.  Counseled patient on potential for adverse effects with medications prescribed/recommended today, ER and return-to-clinic precautions discussed, patient verbalized understanding.  X-ray over-read was pending at time of discharge, recommended follow up with only abnormal results. Otherwise will not call for negative over-read. Patient was in agreement.     Wallis Bamberg, PA-C 03/05/23 1416

## 2023-03-05 NOTE — ED Triage Notes (Signed)
States she "threw her back out a few days ago; I don't know how", but then she slipped on a garden hose in the driveway yesterday, landing on left buttock and side, making her right low back pain more significant. States normally has neuropathy, but has not had any new parasthesias. Has been wearing a back brace, tried a heat patch without relief.

## 2023-05-22 ENCOUNTER — Ambulatory Visit
Admission: EM | Admit: 2023-05-22 | Discharge: 2023-05-22 | Disposition: A | Discharge status: Home / Self Care | Payer: Medicare HMO | Attending: Internal Medicine | Admitting: Internal Medicine

## 2023-05-22 DIAGNOSIS — R051 Acute cough: Secondary | ICD-10-CM

## 2023-05-22 DIAGNOSIS — J029 Acute pharyngitis, unspecified: Secondary | ICD-10-CM

## 2023-05-22 DIAGNOSIS — J069 Acute upper respiratory infection, unspecified: Secondary | ICD-10-CM | POA: Diagnosis not present

## 2023-05-22 LAB — POC COVID19/FLU A&B COMBO
Covid Antigen, POC: NEGATIVE
Influenza A Antigen, POC: NEGATIVE
Influenza B Antigen, POC: NEGATIVE

## 2023-05-22 LAB — POCT RAPID STREP A (OFFICE): Rapid Strep A Screen: NEGATIVE

## 2023-05-22 NOTE — Discharge Instructions (Addendum)
Please treat your symptoms with over the counter cough medication, tylenol or ibuprofen, humidifier, and rest. Viral illnesses can last 7-14 days. Please follow up with your PCP if your symptoms are not improving. Please go to the ER for any worsening symptoms. This includes but is not limited to fever you can not control with tylenol or ibuprofen, you are not able to stay hydrated, you have shortness of breath or chest pain.  Thank you for choosing [facility] for your healthcare needs. I hope you feel better soon!  

## 2023-05-22 NOTE — ED Triage Notes (Signed)
Patient presents to UC for sore throat x 1 day, chest congestion this morning. Exposed to strep, flu A/B, cold. Not taking any OTC meds. Req to rule out covid, flu, strep.  Denies fever.

## 2023-05-22 NOTE — ED Provider Notes (Signed)
[facility]    CSN: [id number] Arrival date & time: 05/22/23  1122      History   Chief Complaint No chief complaint on file.   HPI [name] is a 57 y.o. female  presents for evaluation of URI symptoms for 1 days. Patient reports associated symptoms of sore throat, cough and congestion that started today. Denies N/V/D, fevers, ear pain, body aches, shortness of breath. Patient does not have a hx of asthma. Patient does have a history of smoking many years ago.  Reports this is a nephews have strep flu and COVID.  Pt has taken nothing OTC for symptoms. Pt has no other concerns at this time.   HPI  Past Medical History:  Diagnosis Date   ADD (attention deficit disorder)    Depression    History of gastric bypass    Hypertension    Neuropathy    Spinal cord stimulator status    TIA (transient ischemic attack)     Patient Active Problem List   Diagnosis Date Noted   Colitis 02/26/2018   Spinal cord stimulator status    UNSPECIFIED TACHYCARDIA 01/14/2010   SHORTNESS OF BREATH 01/14/2010   PAROXYSMAL SUPRAVENTRICULAR TACHYCARDIA 12/22/2009    Past Surgical History:  Procedure Laterality Date   ABDOMINAL HYSTERECTOMY     ANKLE SURGERY Left    BACK SURGERY     CHOLECYSTECTOMY     EYE SURGERY     GASTRIC BYPASS     HAND SURGERY     TONSILLECTOMY      OB History     Gravida  2   Para  2   Term      Preterm      AB      Living  2      SAB      IAB      Ectopic      Multiple      Live Births               Home Medications    Prior to Admission medications   Medication Sig Start Date End Date Taking? Authorizing Provider  amphetamine-dextroamphetamine (ADDERALL) 20 MG tablet Take 20 mg by mouth 3 (three) times daily.    [provider]  ARIPiprazole (ABILIFY) 5 MG tablet Take 5 mg by mouth 2 (two) times daily.     [provider]  aspirin EC 81 MG tablet Take 81 mg by mouth daily.    [provider]   CLONIDINE HCL PO Take by mouth.    [provider]  cyclobenzaprine (FLEXERIL) 5 MG tablet Take 1 tablet (5 mg total) by mouth at bedtime as needed for muscle spasms. 03/05/23   Wallis Bamberg, PA-C  DULoxetine (CYMBALTA) 60 MG capsule Take 60 mg by mouth daily.    [provider]  famotidine (PEPCID) 20 MG tablet Take 1 tablet (20 mg total) by mouth 2 (two) times daily. 03/05/23   Wallis Bamberg, PA-C  gabapentin (NEURONTIN) 800 MG tablet Take 800 mg by mouth 4 (four) times daily.     [provider]  HORIZANT 600 MG TBCR Take 1 tablet by mouth 2 (two) times daily.    [provider]  ibuprofen (ADVIL,MOTRIN) 200 MG tablet Take 400-600 mg by mouth every 6 (six) hours as needed for pain.    [provider]  Levorphanol Tartrate 3 MG TABS 1 tab(s) orally every 8 hours    [provider]  lidocaine (LIDODERM) 5 % Place 1 patch onto the skin daily as needed (pain). Remove & Discard patch within 12 hours or as directed by MD    [provider]  methocarbamol (ROBAXIN) 500 MG tablet Take 500 mg by mouth 4 (four) times daily.    [provider]  omeprazole (PRILOSEC) 20 MG capsule Take 1 capsule (20 mg total) by mouth daily. 03/05/23   Wallis Bamberg, PA-C  oxyCODONE-acetaminophen (PERCOCET) 10-325 MG tablet Take 1 tablet by mouth 2 (two) times daily.    [provider]  predniSONE (DELTASONE) 20 MG tablet Take 2 tablets daily with breakfast. 03/05/23   Wallis Bamberg, PA-C    Family History Family History  Problem Relation Age of Onset   Cancer Mother        MELANOMA   Cancer Father        THYROID   Hypertension Father    Cancer Maternal Grandmother        LUNG- SMOKER   Cancer Maternal Grandfather        BONE    Cancer Paternal Grandmother        SKIN- BASAL CELL   Diabetes Maternal Aunt    Heart disease Maternal Aunt    Diabetes Paternal Uncle     Social History Social History   Tobacco Use   Smoking status: Former     Types: Cigarettes   Smokeless tobacco: Never  Vaping Use   Vaping status: Never Used  Substance Use Topics   Alcohol use: Not Currently    Comment: rarely   Drug use: No     Allergies   Other, Apple fruit extract, Iodine, and Shellfish allergy   Review of Systems Review of Systems  HENT:  Positive for congestion and sore throat.   Respiratory:  Positive for cough.      Physical Exam Triage Vital Signs ED Triage Vitals  Encounter Vitals Group     BP 05/22/23 1321 132/78     Systolic BP Percentile --      Diastolic BP Percentile --      Pulse Rate 05/22/23 1321 88     Resp 05/22/23 1321 18     Temp 05/22/23 1321 99.1 F (37.3 C)     Temp Source 05/22/23 1321 Oral     SpO2 05/22/23 1321 97 %     Weight --      Height --      Head Circumference --      Peak Flow --      Pain Score 05/22/23 1317 4     Pain Loc --      Pain Education --      Exclude from Growth Chart --    No data found.  Updated Vital Signs BP 132/78 (BP Location: Right Arm)   Pulse 88   Temp 99.1 F (37.3 C) (Oral)   Resp 18   SpO2 97%   Visual Acuity Right Eye Distance:   Left Eye Distance:   Bilateral Distance:    Right Eye Near:   Left Eye Near:    Bilateral Near:     Physical Exam Vitals and nursing note reviewed.  Constitutional:      General: She is not in acute distress.    Appearance: She is well-developed. She is not ill-appearing.  HENT:     Head: Normocephalic and atraumatic.     Right Ear: Tympanic membrane and ear canal normal.     Left Ear: Tympanic membrane and  ear canal normal.     Nose: Congestion present.     Mouth/Throat:     Mouth: Mucous membranes are moist.     Pharynx: Oropharynx is clear. Uvula midline. Posterior oropharyngeal erythema present.     Tonsils: No tonsillar exudate or tonsillar abscesses.  Eyes:     Conjunctiva/sclera: Conjunctivae normal.     Pupils: Pupils are equal, round, and reactive to light.  Cardiovascular:     Rate and  Rhythm: Normal rate and regular rhythm.     Heart sounds: Normal heart sounds.  Pulmonary:     Effort: Pulmonary effort is normal.     Breath sounds: Normal breath sounds.  Musculoskeletal:     Cervical back: Normal range of motion and neck supple.  Lymphadenopathy:     Cervical: No cervical adenopathy.  Skin:    General: Skin is warm and dry.  Neurological:     General: No focal deficit present.     Mental Status: She is alert and oriented to person, place, and time.  Psychiatric:        Mood and Affect: Mood normal.        Behavior: Behavior normal.      UC Treatments / Results  Labs (all labs ordered are listed, but only abnormal results are displayed) Labs Reviewed  CULTURE, GROUP A STREP [facility])  POCT RAPID STREP A (OFFICE)  POC COVID19/FLU A&B COMBO    EKG   Radiology No results found.  Procedures Procedures (including critical care time)  Medications Ordered in UC Medications - No data to display  Initial Impression / Assessment and Plan / UC Course  I have reviewed the triage vital signs and the nursing notes.  Pertinent labs & imaging results that were available during my care of the patient were reviewed by me and considered in my medical decision making (see chart for details).     Reviewed exam and symptoms with patient.  No red flags.  Negative rapid strep, flu, COVID testing.  Will send throat culture.  Will contact for positive results only.  Discussed viral illness and symptomatic treatment.  PCP follow-up as symptoms do not improve.  ER precautions reviewed. Final Clinical Impressions(s) / UC Diagnoses   Final diagnoses:  Sore throat  Acute cough  Viral upper respiratory illness     Discharge Instructions      Please treat your symptoms with over the counter cough medication, tylenol or ibuprofen, humidifier, and rest. Viral illnesses can last 7-14 days. Please follow up with your PCP if your symptoms are not improving. Please go to the ER  for any worsening symptoms. This includes but is not limited to fever you can not control with tylenol or ibuprofen, you are not able to stay hydrated, you have shortness of breath or chest pain.  Thank you for choosing  for your healthcare needs. I hope you feel better soon!      ED Prescriptions   None    PDMP not reviewed this encounter.   Radford Pax, NP 05/22/23 1351

## 2023-05-25 LAB — CULTURE, GROUP A STREP (THRC)

## 2023-05-27 ENCOUNTER — Ambulatory Visit
Admission: RE | Admit: 2023-05-27 | Discharge: 2023-05-27 | Disposition: A | Discharge status: Home / Self Care | Payer: Medicare HMO | Source: Ambulatory Visit | Attending: Family Medicine | Admitting: Family Medicine

## 2023-05-27 VITALS — BP 137/78 | HR 100 | Temp 98.9°F | Resp 16

## 2023-05-27 DIAGNOSIS — J208 Acute bronchitis due to other specified organisms: Secondary | ICD-10-CM

## 2023-05-27 DIAGNOSIS — B9689 Other specified bacterial agents as the cause of diseases classified elsewhere: Secondary | ICD-10-CM

## 2023-05-27 MED ORDER — PREDNISONE 20 MG PO TABS: ORAL_TABLET | ORAL | 0 refills | Status: AC

## 2023-05-27 MED ORDER — PROMETHAZINE-DM 6.25-15 MG/5ML PO SYRP: 5.0000 mL | ORAL_SOLUTION | Freq: Three times a day (TID) | ORAL | 0 refills | Status: AC | PRN

## 2023-05-27 MED ORDER — AZITHROMYCIN 250 MG PO TABS: ORAL_TABLET | ORAL | 0 refills | Status: AC

## 2023-05-27 NOTE — ED Triage Notes (Signed)
Pt c/o cough, head/chest congestion x 6 days-seen here 12/15-c/o cont'd sx and fatigue and sweats-NAD-steady gait

## 2023-05-27 NOTE — ED Provider Notes (Signed)
[facility]  Note:  This document was prepared using Conservation officer, historic buildings and may include unintentional dictation errors.  MRN: [id number] DOB: 1966/05/28  Subjective:   [name] is a 57 y.o. female presenting for a recheck on persistent productive cough, chest congestion, malaise and fatigue.  Patient is now experiencing chest pain when she takes of breath or coughs.  Was seen here 05/22/2023.  Was advised supportive care.  Patient reports that she typically does well with steroids.  Despite her gastric bypass, has taken multiple rounds over the years and has never had any issues.  No smoking of any kind including cigarettes, cigars, vaping, marijuana use.    No current facility-administered medications for this encounter.  Current Outpatient Medications:    amphetamine-dextroamphetamine (ADDERALL) 20 MG tablet, Take 20 mg by mouth 3 (three) times daily., Disp: , Rfl:    ARIPiprazole (ABILIFY) 5 MG tablet, Take 5 mg by mouth 2 (two) times daily. , Disp: , Rfl:    aspirin EC 81 MG tablet, Take 81 mg by mouth daily., Disp: , Rfl:    CLONIDINE HCL PO, Take by mouth., Disp: , Rfl:    cyclobenzaprine (FLEXERIL) 5 MG tablet, Take 1 tablet (5 mg total) by mouth at bedtime as needed for muscle spasms., Disp: 30 tablet, Rfl: 0   DULoxetine (CYMBALTA) 60 MG capsule, Take 60 mg by mouth daily., Disp: , Rfl:    famotidine (PEPCID) 20 MG tablet, Take 1 tablet (20 mg total) by mouth 2 (two) times daily., Disp: 30 tablet, Rfl: 0   gabapentin (NEURONTIN) 800 MG tablet, Take 800 mg by mouth 4 (four) times daily. , Disp: , Rfl:    HORIZANT 600 MG TBCR, Take 1 tablet by mouth 2 (two) times daily., Disp: , Rfl:    ibuprofen (ADVIL,MOTRIN) 200 MG tablet, Take 400-600 mg by mouth every 6 (six) hours as needed for pain., Disp: , Rfl:    Levorphanol Tartrate 3 MG TABS, 1 tab(s) orally every 8 hours, Disp: , Rfl:    lidocaine (LIDODERM) 5 %, Place 1 patch onto the skin  daily as needed (pain). Remove & Discard patch within 12 hours or as directed by MD, Disp: , Rfl:    methocarbamol (ROBAXIN) 500 MG tablet, Take 500 mg by mouth 4 (four) times daily., Disp: , Rfl:    omeprazole (PRILOSEC) 20 MG capsule, Take 1 capsule (20 mg total) by mouth daily., Disp: 30 capsule, Rfl: 0   oxyCODONE-acetaminophen (PERCOCET) 10-325 MG tablet, Take 1 tablet by mouth 2 (two) times daily., Disp: , Rfl:    predniSONE (DELTASONE) 20 MG tablet, Take 2 tablets daily with breakfast., Disp: 10 tablet, Rfl: 0   Allergies  Allergen Reactions   Other Shortness Of Breath    Egg plant   Apple Fruit Extract Nausea And Vomiting   Iodine Hives   Shellfish Allergy Hives    Past Medical History:  Diagnosis Date   ADD (attention deficit disorder)    Depression    History of gastric bypass    Hypertension    Neuropathy    Spinal cord stimulator status    TIA (transient ischemic attack)      Past Surgical History:  Procedure Laterality Date   ABDOMINAL HYSTERECTOMY     ANKLE SURGERY Left    BACK SURGERY     CHOLECYSTECTOMY     EYE SURGERY     GASTRIC BYPASS     HAND SURGERY  TONSILLECTOMY      Family History  Problem Relation Age of Onset   Cancer Mother        MELANOMA   Cancer Father        THYROID   Hypertension Father    Cancer Maternal Grandmother        LUNG- SMOKER   Cancer Maternal Grandfather        BONE    Cancer Paternal Grandmother        SKIN- BASAL CELL   Diabetes Maternal Aunt    Heart disease Maternal Aunt    Diabetes Paternal Uncle     Social History   Tobacco Use   Smoking status: Former    Types: Cigarettes   Smokeless tobacco: Never  Vaping Use   Vaping status: Never Used  Substance Use Topics   Alcohol use: Not Currently   Drug use: No    ROS   Objective:   Vitals: BP 137/78 (BP Location: Right Arm)   Pulse 100   Temp 98.9 F (37.2 C) (Oral)   Resp 16   SpO2 96%   Physical Exam Constitutional:      General: She is  not in acute distress.    Appearance: Normal appearance. She is well-developed and normal weight. She is not ill-appearing, toxic-appearing or diaphoretic.  HENT:     Head: Normocephalic and atraumatic.     Right Ear: Tympanic membrane, ear canal and external ear normal. No drainage or tenderness. No middle ear effusion. There is no impacted cerumen. Tympanic membrane is not erythematous or bulging.     Left Ear: Tympanic membrane, ear canal and external ear normal. No drainage or tenderness.  No middle ear effusion. There is no impacted cerumen. Tympanic membrane is not erythematous or bulging.     Nose: Nose normal. No congestion or rhinorrhea.     Mouth/Throat:     Mouth: Mucous membranes are moist. No oral lesions.     Pharynx: No pharyngeal swelling, oropharyngeal exudate, posterior oropharyngeal erythema or uvula swelling.     Tonsils: No tonsillar exudate or tonsillar abscesses.  Eyes:     General: No scleral icterus.       Right eye: No discharge.        Left eye: No discharge.     Extraocular Movements: Extraocular movements intact.     Right eye: Normal extraocular motion.     Left eye: Normal extraocular motion.     Conjunctiva/sclera: Conjunctivae normal.  Cardiovascular:     Rate and Rhythm: Normal rate and regular rhythm.     Heart sounds: Normal heart sounds. No murmur heard.    No friction rub. No gallop.  Pulmonary:     Effort: Pulmonary effort is normal. No respiratory distress.     Breath sounds: No stridor. Rhonchi present. No wheezing or rales.  Chest:     Chest wall: No tenderness.  Musculoskeletal:     Cervical back: Normal range of motion and neck supple.  Lymphadenopathy:     Cervical: No cervical adenopathy.  Skin:    General: Skin is warm and dry.  Neurological:     General: No focal deficit present.     Mental Status: She is alert and oriented to person, place, and time.  Psychiatric:        Mood and Affect: Mood normal.        Behavior: Behavior  normal.     Assessment and Plan :   PDMP not reviewed this encounter.  1. Acute bacterial bronchitis    Patient and I agreed to defer imaging for now.  Recommended managing for bacterial bronchitis with azithromycin and prednisone.  Use supportive care as well.  Counseled patient on potential for adverse effects with medications prescribed/recommended today, ER and return-to-clinic precautions discussed, patient verbalized understanding.    Wallis Bamberg, PA-C 05/27/23 1600

## 2023-05-29 ENCOUNTER — Ambulatory Visit
Admission: EM | Admit: 2023-05-29 | Discharge: 2023-05-29 | Disposition: A | Discharge status: Home / Self Care | Payer: Medicare HMO | Attending: Family Medicine | Admitting: Family Medicine

## 2023-05-29 ENCOUNTER — Ambulatory Visit (INDEPENDENT_AMBULATORY_CARE_PROVIDER_SITE_OTHER): Payer: Medicare HMO

## 2023-05-29 DIAGNOSIS — J208 Acute bronchitis due to other specified organisms: Secondary | ICD-10-CM

## 2023-05-29 DIAGNOSIS — B9689 Other specified bacterial agents as the cause of diseases classified elsewhere: Secondary | ICD-10-CM | POA: Diagnosis not present

## 2023-05-29 DIAGNOSIS — R053 Chronic cough: Secondary | ICD-10-CM

## 2023-05-29 MED ORDER — BENZONATATE 100 MG PO CAPS: 100.0000 mg | ORAL_CAPSULE | Freq: Three times a day (TID) | ORAL | 0 refills | Status: AC | PRN

## 2023-05-29 MED ORDER — ALBUTEROL SULFATE HFA 108 (90 BASE) MCG/ACT IN AERS: 1.0000 | INHALATION_SPRAY | Freq: Four times a day (QID) | RESPIRATORY_TRACT | 0 refills | Status: AC | PRN

## 2023-05-29 NOTE — ED Triage Notes (Signed)
Pt reports shortness of breath, back pain and chest pain when breathing  x 1 week. States she felt out or breath 2 days ago and improved some after using her daughter inhaler. States prednisone, azithromycin and promethazine gives some relief.

## 2023-05-29 NOTE — ED Provider Notes (Signed)
[facility]  Note:  This document was prepared using Conservation officer, historic buildings and may include unintentional dictation errors.  MRN: [id number] DOB: [birth year]  Subjective:   LAYLONIE ENGBLOM is a 57 y.o. female presenting for 1 week history of persistent and worsening coughing bronchospasms, shortness of breath.  Patient is being managed for acute bacterial bronchitis with prednisone and azithromycin.  She would like a different cough medication.   No current facility-administered medications for this encounter.  Current Outpatient Medications:    amphetamine-dextroamphetamine (ADDERALL) 20 MG tablet, Take 20 mg by mouth 3 (three) times daily., Disp: , Rfl:    ARIPiprazole (ABILIFY) 5 MG tablet, Take 5 mg by mouth 2 (two) times daily. , Disp: , Rfl:    aspirin EC 81 MG tablet, Take 81 mg by mouth daily., Disp: , Rfl:    azithromycin (ZITHROMAX) 250 MG tablet, Day 1: take 2 tablets. Day 2-5: Take 1 tablet daily., Disp: 6 tablet, Rfl: 0   CLONIDINE HCL PO, Take by mouth., Disp: , Rfl:    cyclobenzaprine (FLEXERIL) 5 MG tablet, Take 1 tablet (5 mg total) by mouth at bedtime as needed for muscle spasms., Disp: 30 tablet, Rfl: 0   DULoxetine (CYMBALTA) 60 MG capsule, Take 60 mg by mouth daily., Disp: , Rfl:    famotidine (PEPCID) 20 MG tablet, Take 1 tablet (20 mg total) by mouth 2 (two) times daily., Disp: 30 tablet, Rfl: 0   gabapentin (NEURONTIN) 800 MG tablet, Take 800 mg by mouth 4 (four) times daily. , Disp: , Rfl:    HORIZANT 600 MG TBCR, Take 1 tablet by mouth 2 (two) times daily., Disp: , Rfl:    ibuprofen (ADVIL,MOTRIN) 200 MG tablet, Take 400-600 mg by mouth every 6 (six) hours as needed for pain., Disp: , Rfl:    Levorphanol Tartrate 3 MG TABS, 1 tab(s) orally every 8 hours, Disp: , Rfl:    lidocaine (LIDODERM) 5 %, Place 1 patch onto the skin daily as needed (pain). Remove & Discard patch within 12 hours or as directed by MD, Disp: , Rfl:     methocarbamol (ROBAXIN) 500 MG tablet, Take 500 mg by mouth 4 (four) times daily., Disp: , Rfl:    omeprazole (PRILOSEC) 20 MG capsule, Take 1 capsule (20 mg total) by mouth daily., Disp: 30 capsule, Rfl: 0   oxyCODONE-acetaminophen (PERCOCET) 10-325 MG tablet, Take 1 tablet by mouth 2 (two) times daily., Disp: , Rfl:    predniSONE (DELTASONE) 20 MG tablet, Take 2 tablets daily with breakfast., Disp: 10 tablet, Rfl: 0   promethazine-dextromethorphan (PROMETHAZINE-DM) 6.25-15 MG/5ML syrup, Take 5 mLs by mouth 3 (three) times daily as needed for cough., Disp: 200 mL, Rfl: 0   Allergies  Allergen Reactions   Iodine Hives and Other (See Comments)    Includes seafood   Lactulose Other (See Comments) and Shortness Of Breath    "swiss cheese"     "swiss cheese"   Other Shortness Of Breath    Egg plant   Apple Nausea And Vomiting   Apple Fruit Extract Nausea And Vomiting and Nausea Only   Apple Juice Nausea And Vomiting   Shellfish Allergy Hives    Past Medical History:  Diagnosis Date   ADD (attention deficit disorder)    Depression    History of gastric bypass    Hypertension    Neuropathy    Spinal cord stimulator status    TIA (transient ischemic attack)  Past Surgical History:  Procedure Laterality Date   ABDOMINAL HYSTERECTOMY     ANKLE SURGERY Left    BACK SURGERY     CHOLECYSTECTOMY     EYE SURGERY     GASTRIC BYPASS     HAND SURGERY     TONSILLECTOMY      Family History  Problem Relation Age of Onset   Cancer Mother        MELANOMA   Cancer Father        THYROID   Hypertension Father    Cancer Maternal Grandmother        LUNG- SMOKER   Cancer Maternal Grandfather        BONE    Cancer Paternal Grandmother        SKIN- BASAL CELL   Diabetes Maternal Aunt    Heart disease Maternal Aunt    Diabetes Paternal Uncle     Social History   Tobacco Use   Smoking status: Former    Types: Cigarettes   Smokeless tobacco: Never  Vaping Use   Vaping status:  Never Used  Substance Use Topics   Alcohol use: Not Currently   Drug use: No    ROS   Objective:   Vitals: BP (!) 156/94 (BP Location: Left Arm)   Pulse (!) 107   Temp 99.5 F (37.5 C) (Oral)   Resp 20   SpO2 99%   Pulse recheck 88 bpm by PA Johnrobert Foti.  Physical Exam Constitutional:      General: She is not in acute distress.    Appearance: Normal appearance. She is well-developed. She is not ill-appearing, toxic-appearing or diaphoretic.  HENT:     Head: Normocephalic and atraumatic.     Nose: Nose normal.     Mouth/Throat:     Mouth: Mucous membranes are moist.  Eyes:     General: No scleral icterus.       Right eye: No discharge.        Left eye: No discharge.     Extraocular Movements: Extraocular movements intact.  Cardiovascular:     Rate and Rhythm: Normal rate and regular rhythm.     Heart sounds: Normal heart sounds. No murmur heard.    No friction rub. No gallop.  Pulmonary:     Effort: Pulmonary effort is normal. No respiratory distress.     Breath sounds: No stridor. No wheezing, rhonchi or rales.  Chest:     Chest wall: No tenderness.  Skin:    General: Skin is warm and dry.  Neurological:     General: No focal deficit present.     Mental Status: She is alert and oriented to person, place, and time.  Psychiatric:        Mood and Affect: Mood normal.        Behavior: Behavior normal.     DG Chest 2 View Result Date: 05/29/2023 CLINICAL DATA:  Persistent cough. EXAM: CHEST - 2 VIEW COMPARISON:  Chest radiograph dated 12/08/2019. FINDINGS: No focal consolidation, pleural effusion or pneumothorax. The cardiac silhouette is within normal limits. Spinal stimulator. No acute osseous pathology. IMPRESSION: No active cardiopulmonary disease. Electronically Signed   By: Elgie Collard M.D.   On: 05/29/2023 15:53   Assessment and Plan :   PDMP not reviewed this encounter.  1. Persistent cough   2. Acute bacterial bronchitis    Recommended continued  supportive care, finish out azithromycin and prednisone.  I am adding on albuterol inhaler.  Use cough  capsules in addition to the promethazine.  Otherwise chest x-ray negative.  Low suspicion for pulmonary embolism.  Counseled patient on potential for adverse effects with medications prescribed/recommended today, ER and return-to-clinic precautions discussed, patient verbalized understanding.    Wallis Bamberg, [place] 05/29/23 1610

## 2023-06-20 ENCOUNTER — Emergency Department (HOSPITAL_BASED_OUTPATIENT_CLINIC_OR_DEPARTMENT_OTHER): Payer: Medicare HMO

## 2023-06-20 ENCOUNTER — Encounter (HOSPITAL_BASED_OUTPATIENT_CLINIC_OR_DEPARTMENT_OTHER): Payer: Self-pay | Admitting: Emergency Medicine

## 2023-06-20 ENCOUNTER — Other Ambulatory Visit: Payer: Self-pay

## 2023-06-20 ENCOUNTER — Ambulatory Visit
Admission: RE | Admit: 2023-06-20 | Discharge: 2023-06-20 | Disposition: A | Discharge status: Home / Self Care | Payer: Medicare HMO | Source: Ambulatory Visit | Attending: Family Medicine | Admitting: Family Medicine

## 2023-06-20 ENCOUNTER — Emergency Department (HOSPITAL_BASED_OUTPATIENT_CLINIC_OR_DEPARTMENT_OTHER)
Admission: EM | Admit: 2023-06-20 | Discharge: 2023-06-21 | Disposition: A | Discharge status: Home / Self Care | Payer: Medicare HMO | Attending: Emergency Medicine | Admitting: Emergency Medicine

## 2023-06-20 VITALS — BP 145/80 | HR 81 | Temp 98.6°F | Resp 20

## 2023-06-20 DIAGNOSIS — R0602 Shortness of breath: Secondary | ICD-10-CM | POA: Diagnosis present

## 2023-06-20 DIAGNOSIS — Z20822 Contact with and (suspected) exposure to covid-19: Secondary | ICD-10-CM | POA: Diagnosis not present

## 2023-06-20 DIAGNOSIS — R000 Tachycardia, unspecified: Secondary | ICD-10-CM | POA: Diagnosis not present

## 2023-06-20 DIAGNOSIS — D649 Anemia, unspecified: Secondary | ICD-10-CM | POA: Diagnosis not present

## 2023-06-20 DIAGNOSIS — R059 Cough, unspecified: Secondary | ICD-10-CM | POA: Diagnosis not present

## 2023-06-20 DIAGNOSIS — I10 Essential (primary) hypertension: Secondary | ICD-10-CM | POA: Diagnosis not present

## 2023-06-20 DIAGNOSIS — R0789 Other chest pain: Secondary | ICD-10-CM | POA: Diagnosis not present

## 2023-06-20 DIAGNOSIS — D72829 Elevated white blood cell count, unspecified: Secondary | ICD-10-CM | POA: Insufficient documentation

## 2023-06-20 DIAGNOSIS — R0600 Dyspnea, unspecified: Secondary | ICD-10-CM

## 2023-06-20 DIAGNOSIS — Z7982 Long term (current) use of aspirin: Secondary | ICD-10-CM | POA: Diagnosis not present

## 2023-06-20 LAB — CBC
HCT: 28.6 % — ABNORMAL LOW (ref 36.0–46.0)
Hemoglobin: 9.1 g/dL — ABNORMAL LOW (ref 12.0–15.0)
MCH: 30.4 pg (ref 26.0–34.0)
MCHC: 31.8 g/dL (ref 30.0–36.0)
MCV: 95.7 fL (ref 80.0–100.0)
Platelets: 118 10*3/uL — ABNORMAL LOW (ref 150–400)
RBC: 2.99 MIL/uL — ABNORMAL LOW (ref 3.87–5.11)
RDW: 17.5 % — ABNORMAL HIGH (ref 11.5–15.5)
WBC: 17.5 10*3/uL — ABNORMAL HIGH (ref 4.0–10.5)
nRBC: 0.2 % (ref 0.0–0.2)

## 2023-06-20 LAB — BASIC METABOLIC PANEL
Anion gap: 13 (ref 5–15)
BUN: 13 mg/dL (ref 6–20)
CO2: 23 mmol/L (ref 22–32)
Calcium: 8.7 mg/dL — ABNORMAL LOW (ref 8.9–10.3)
Chloride: 98 mmol/L (ref 98–111)
Creatinine, Ser: 1.06 mg/dL — ABNORMAL HIGH (ref 0.44–1.00)
GFR, Estimated: >60 mL/min (ref >60)
Glucose, Bld: 99 mg/dL (ref 70–99)
Potassium: 3.6 mmol/L (ref 3.5–5.1)
Sodium: 134 mmol/L — ABNORMAL LOW (ref 135–145)

## 2023-06-20 LAB — TROPONIN I (HIGH SENSITIVITY)
Troponin I (High Sensitivity): 6 ng/L (ref <18)
Troponin I (High Sensitivity): 5 ng/L (ref <18)

## 2023-06-20 LAB — RESP PANEL BY RT-PCR (RSV, FLU A&B, COVID)  RVPGX2
Influenza A by PCR: NEGATIVE
Influenza B by PCR: NEGATIVE
Resp Syncytial Virus by PCR: NEGATIVE
SARS Coronavirus 2 by RT PCR: NEGATIVE

## 2023-06-20 MED ORDER — METHYLPREDNISOLONE SODIUM SUCC 40 MG IJ SOLR
40.0000 mg | Freq: Once | INTRAMUSCULAR | Status: AC
  Administered 2023-06-20: 40 mg via INTRAVENOUS
  Filled 2023-06-20: qty 1

## 2023-06-20 MED ORDER — IOHEXOL 350 MG/ML SOLN
75.0000 mL | Freq: Once | INTRAVENOUS | Status: AC | PRN
  Administered 2023-06-20: 75 mL via INTRAVENOUS

## 2023-06-20 MED ORDER — DIPHENHYDRAMINE HCL 50 MG/ML IJ SOLN: 50.0000 mg | Freq: Once | INTRAMUSCULAR | Status: AC

## 2023-06-20 MED ORDER — DIPHENHYDRAMINE HCL 25 MG PO CAPS
50.0000 mg | ORAL_CAPSULE | Freq: Once | ORAL | Status: AC
  Administered 2023-06-20: 50 mg via ORAL
  Filled 2023-06-20: qty 2

## 2023-06-20 NOTE — Discharge Instructions (Addendum)
 I am concerned that you are in need of a higher level of care than we can provide in the urgent care setting. You have continued shortness of breath, wheezing that has not responded to steroids, antibiotics and an inhaler.

## 2023-06-20 NOTE — ED Provider Notes (Signed)
 [facility]  Note:  This document was prepared using Conservation officer, historic buildings and may include unintentional dictation errors.  MRN: [id number] DOB: 05-16-1966  Subjective:   [name] is a 58 y.o. female presenting for recheck on 5-week history of persistent shortness of breath, chest tightness, wheezing, coughing.  Patient has been seen 3 times in in December for this.  She has undergone a course of prednisone , azithromycin  and has had a negative chest x-ray.  Has been using the albuterol  inhaler prescribed.  Feels like not much has changed.  No smoking.  No current facility-administered medications for this encounter.  Current Outpatient Medications:    albuterol  (VENTOLIN  HFA) 108 (90 Base) MCG/ACT inhaler, Inhale 1-2 puffs into the lungs every 6 (six) hours as needed for wheezing or shortness of breath., Disp: 18 g, Rfl: 0   amphetamine -dextroamphetamine  (ADDERALL) 20 MG tablet, Take 20 mg by mouth 3 (three) times daily., Disp: , Rfl:    ARIPiprazole  (ABILIFY ) 5 MG tablet, Take 5 mg by mouth 2 (two) times daily. , Disp: , Rfl:    aspirin  EC 81 MG tablet, Take 81 mg by mouth daily., Disp: , Rfl:    azithromycin  (ZITHROMAX ) 250 MG tablet, Day 1: take 2 tablets. Day 2-5: Take 1 tablet daily., Disp: 6 tablet, Rfl: 0   benzonatate  (TESSALON ) 100 MG capsule, Take 1 capsule (100 mg total) by mouth 3 (three) times daily as needed for cough., Disp: 30 capsule, Rfl: 0   CLONIDINE HCL PO, Take by mouth., Disp: , Rfl:    cyclobenzaprine  (FLEXERIL ) 5 MG tablet, Take 1 tablet (5 mg total) by mouth at bedtime as needed for muscle spasms., Disp: 30 tablet, Rfl: 0   DULoxetine  (CYMBALTA ) 60 MG capsule, Take 60 mg by mouth daily., Disp: , Rfl:    famotidine  (PEPCID ) 20 MG tablet, Take 1 tablet (20 mg total) by mouth 2 (two) times daily., Disp: 30 tablet, Rfl: 0   gabapentin  (NEURONTIN ) 800 MG tablet, Take 800 mg by mouth 4 (four) times daily. , Disp: , Rfl:    HORIZANT  600 MG TBCR, Take 1 tablet by mouth 2 (two) times daily., Disp: , Rfl:    ibuprofen  (ADVIL ,MOTRIN ) 200 MG tablet, Take 400-600 mg by mouth every 6 (six) hours as needed for pain., Disp: , Rfl:    Levorphanol Tartrate 3 MG TABS, 1 tab(s) orally every 8 hours, Disp: , Rfl:    lidocaine  (LIDODERM ) 5 %, Place 1 patch onto the skin daily as needed (pain). Remove & Discard patch within 12 hours or as directed by MD, Disp: , Rfl:    methocarbamol (ROBAXIN) 500 MG tablet, Take 500 mg by mouth 4 (four) times daily., Disp: , Rfl:    omeprazole  (PRILOSEC) 20 MG capsule, Take 1 capsule (20 mg total) by mouth daily., Disp: 30 capsule, Rfl: 0   oxyCODONE -acetaminophen  (PERCOCET) 10-325 MG tablet, Take 1 tablet by mouth 2 (two) times daily., Disp: , Rfl:    predniSONE  (DELTASONE ) 20 MG tablet, Take 2 tablets daily with breakfast., Disp: 10 tablet, Rfl: 0   promethazine -dextromethorphan (PROMETHAZINE -DM) 6.25-15 MG/5ML syrup, Take 5 mLs by mouth 3 (three) times daily as needed for cough., Disp: 200 mL, Rfl: 0   Allergies  Allergen Reactions   Iodine Hives and Other (See Comments)    Includes seafood   Lactulose Other (See Comments) and Shortness Of Breath    swiss cheese     swiss cheese   Other Shortness Of  Breath    Egg plant   Apple Nausea And Vomiting   Apple Fruit Extract Nausea And Vomiting and Nausea Only   Apple Juice Nausea And Vomiting   Shellfish Allergy Hives    Past Medical History:  Diagnosis Date   ADD (attention deficit disorder)    Depression    History of gastric bypass    Hypertension    Neuropathy    Spinal cord stimulator status    TIA (transient ischemic attack)      Past Surgical History:  Procedure Laterality Date   ABDOMINAL HYSTERECTOMY     ANKLE SURGERY Left    BACK SURGERY     CHOLECYSTECTOMY     EYE SURGERY     GASTRIC BYPASS     HAND SURGERY     TONSILLECTOMY      Family History  Problem Relation Age of Onset   Cancer Mother        MELANOMA    Cancer Father        THYROID    Hypertension Father    Cancer Maternal Grandmother        LUNG- SMOKER   Cancer Maternal Grandfather        BONE    Cancer Paternal Grandmother        SKIN- BASAL CELL   Diabetes Maternal Aunt    Heart disease Maternal Aunt    Diabetes Paternal Uncle     Social History   Tobacco Use   Smoking status: Former    Types: Cigarettes   Smokeless tobacco: Never  Vaping Use   Vaping status: Never Used  Substance Use Topics   Alcohol  use: Not Currently   Drug use: No    ROS   Objective:   Vitals: BP (!) 145/80 (BP Location: Right Arm)   Pulse 81   Temp 98.6 F (37 C) (Oral)   Resp 20   SpO2 96%   Physical Exam Constitutional:      General: She is not in acute distress.    Appearance: Normal appearance. She is well-developed. She is not ill-appearing, toxic-appearing or diaphoretic.  HENT:     Head: Normocephalic and atraumatic.     Nose: Nose normal.     Mouth/Throat:     Mouth: Mucous membranes are moist.  Eyes:     General: No scleral icterus.       Right eye: No discharge.        Left eye: No discharge.     Extraocular Movements: Extraocular movements intact.  Cardiovascular:     Rate and Rhythm: Regular rhythm.     Heart sounds: Normal heart sounds. No murmur heard.    No friction rub. No gallop.     Comments: Patient was challenged with a brisk walk in clinic, this resulted in a pulse that was sustained above 100 bpm and hovered at 107bpm with associated shortness of breath. Pulmonary:     Effort: Pulmonary effort is normal. No respiratory distress.     Breath sounds: No stridor. No wheezing, rhonchi or rales.  Chest:     Chest wall: No tenderness.  Skin:    General: Skin is warm and dry.  Neurological:     General: No focal deficit present.     Mental Status: She is alert and oriented to person, place, and time.  Psychiatric:        Mood and Affect: Mood normal.        Behavior: Behavior normal.  Assessment and  Plan :   PDMP not reviewed this encounter.  1. Shortness of breath   2. Tachycardia    Pulmonary exam was clear.  Advised that she has failed outpatient management and now warrants higher level of testing and intervention than we can provide in the urgent care setting.  This includes consideration for CT imaging and point-of-care blood work.  Patient contracts for safety and will present to the emergency room now.   Christopher Savannah, PA-C 06/20/23 1407
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
## 2023-06-20 NOTE — ED Triage Notes (Addendum)
 Pt c/o dry cough, wheezing, SHOB, fatigue x 2 months-states she has been seen here for same x 3 visits-she feels wheezing and SHOB is worse x 3 days-NAD-steady gait

## 2023-06-20 NOTE — ED Triage Notes (Signed)
 Cough and shortness of breath x 3 days . Bronchitis last month .

## 2023-06-20 NOTE — ED Notes (Signed)
 Patient transported to CT

## 2023-06-28 ENCOUNTER — Ambulatory Visit: Payer: Medicare HMO | Admitting: Internal Medicine
